# Patient Record
Sex: Male | Born: 1955
Health system: Southern US, Community
[De-identification: ages and names within clinical notes are randomized; demographics above are authoritative.]

## PROBLEM LIST (undated history)

## (undated) DIAGNOSIS — N400 Enlarged prostate without lower urinary tract symptoms: Secondary | ICD-10-CM

## (undated) DIAGNOSIS — G4700 Insomnia, unspecified: Secondary | ICD-10-CM

## (undated) DIAGNOSIS — E785 Hyperlipidemia, unspecified: Secondary | ICD-10-CM

## (undated) DIAGNOSIS — I10 Essential (primary) hypertension: Secondary | ICD-10-CM

## (undated) DIAGNOSIS — F419 Anxiety disorder, unspecified: Secondary | ICD-10-CM

## (undated) DIAGNOSIS — K219 Gastro-esophageal reflux disease without esophagitis: Secondary | ICD-10-CM

## (undated) HISTORY — DX: Insomnia, unspecified: G47.00

## (undated) HISTORY — DX: Anxiety disorder, unspecified: F41.9

## (undated) HISTORY — DX: Hyperlipidemia, unspecified: E78.5

## (undated) HISTORY — DX: Benign prostatic hyperplasia without lower urinary tract symptoms: N40.0

## (undated) HISTORY — DX: Gastro-esophageal reflux disease without esophagitis: K21.9

## (undated) HISTORY — PX: HERNIA REPAIR: SHX51

---

## 2000-10-23 ENCOUNTER — Emergency Department (HOSPITAL_COMMUNITY): Admission: EM | Admit: 2000-10-23 | Discharge: 2000-10-23 | Payer: Self-pay

## 2008-01-12 ENCOUNTER — Emergency Department (HOSPITAL_COMMUNITY): Admission: EM | Admit: 2008-01-12 | Discharge: 2008-01-12 | Payer: Self-pay | Admitting: Emergency Medicine

## 2010-10-13 ENCOUNTER — Other Ambulatory Visit: Payer: Self-pay | Admitting: Family Medicine

## 2010-10-17 LAB — POCT I-STAT, CHEM 8
BUN: 19 mg/dL (ref 6–23)
Calcium, Ion: 1.12 mmol/L (ref 1.12–1.32)
Chloride: 108 mEq/L (ref 96–112)
Creatinine, Ser: 1.2 mg/dL (ref 0.4–1.5)
Glucose, Bld: 99 mg/dL (ref 70–99)
HCT: 47 % (ref 39.0–52.0)
Hemoglobin: 16 g/dL (ref 13.0–17.0)
Potassium: 3.8 mEq/L (ref 3.5–5.1)
Sodium: 141 mEq/L (ref 135–145)
TCO2: 26 mmol/L (ref 0–100)

## 2010-10-17 LAB — URINALYSIS, ROUTINE W REFLEX MICROSCOPIC
Glucose, UA: NEGATIVE mg/dL
Nitrite: NEGATIVE
Protein, ur: 100 mg/dL — AB
Specific Gravity, Urine: 1.028 (ref 1.005–1.030)
Urobilinogen, UA: 0.2 mg/dL (ref 0.0–1.0)
pH: 5.5 (ref 5.0–8.0)

## 2010-10-17 LAB — URINE CULTURE
Colony Count: NO GROWTH
Culture: NO GROWTH

## 2010-10-17 LAB — URINE MICROSCOPIC-ADD ON

## 2010-10-21 ENCOUNTER — Ambulatory Visit
Admission: RE | Admit: 2010-10-21 | Discharge: 2010-10-21 | Disposition: A | Discharge status: Home / Self Care | Payer: BC Managed Care – PPO | Source: Ambulatory Visit | Attending: Family Medicine | Admitting: Family Medicine

## 2011-08-27 ENCOUNTER — Encounter: Payer: Self-pay | Admitting: *Deleted

## 2011-09-09 ENCOUNTER — Ambulatory Visit: Payer: BC Managed Care – PPO | Admitting: Cardiology

## 2011-10-16 ENCOUNTER — Ambulatory Visit (INDEPENDENT_AMBULATORY_CARE_PROVIDER_SITE_OTHER): Payer: BC Managed Care – PPO | Admitting: Cardiology

## 2011-10-16 ENCOUNTER — Encounter: Payer: Self-pay | Admitting: Cardiology

## 2011-10-16 VITALS — BP 163/111 | HR 87 | Ht 69.0 in | Wt 197.1 lb

## 2011-10-16 DIAGNOSIS — Z9189 Other specified personal risk factors, not elsewhere classified: Secondary | ICD-10-CM

## 2011-10-16 DIAGNOSIS — I10 Essential (primary) hypertension: Secondary | ICD-10-CM

## 2011-10-16 MED ORDER — CHLORTHALIDONE 25 MG PO TABS: 25.0000 mg | ORAL_TABLET | Freq: Every day | ORAL | Status: DC

## 2011-10-16 MED ORDER — POTASSIUM CHLORIDE ER 10 MEQ PO TBCR: 10.0000 meq | EXTENDED_RELEASE_TABLET | Freq: Every day | ORAL | Status: DC

## 2011-10-16 MED ORDER — AMLODIPINE BESYLATE 10 MG PO TABS: 10.0000 mg | ORAL_TABLET | Freq: Every day | ORAL | Status: DC

## 2011-10-16 MED ORDER — ASPIRIN EC 81 MG PO TBEC: 81.0000 mg | DELAYED_RELEASE_TABLET | Freq: Every day | ORAL | Status: DC

## 2011-10-16 MED ORDER — AMLODIPINE BESYLATE 10 MG PO TABS: 10.0000 mg | ORAL_TABLET | Freq: Every day | ORAL | Status: AC

## 2011-10-16 NOTE — Patient Instructions (Addendum)
Your physician recommends that you schedule a follow-up appointment in:  1 month  Your physician has requested that you have an echocardiogram. Echocardiography is a painless test that uses sound waves to create images of your heart. It provides your doctor with information about the size and shape of your heart and how well your heart's chambers and valves are working. This procedure takes approximately one hour. There are no restrictions for this procedure.   Your physician has requested that you have an exercise tolerance test. Can be done with NP or PA.  For further information please visit [email or web address] Please also follow instruction sheet, as given.  Your physician recommends that you return for fasting lab work in:  2 weeks--Lipid and liver profiles and BMP   Your physician has recommended you make the following change in your medication: Stop hydrochlorothiazide. Increase amlodipine to 10 mg by mouth daily. Start chlorthalidone 25 mg by mouth daily. Start potassium 10 meq by mouth daily.  Start aspirin 81 mg by mouth daily.    Check blood pressure daily and keep record.  Katina Dung (Dr. Alford Highland nurse) will call you in 2 weeks to get these readings.

## 2011-10-18 DIAGNOSIS — Z9189 Other specified personal risk factors, not elsewhere classified: Secondary | ICD-10-CM | POA: Insufficient documentation

## 2011-10-18 DIAGNOSIS — I10 Essential (primary) hypertension: Secondary | ICD-10-CM | POA: Insufficient documentation

## 2011-10-18 NOTE — Progress Notes (Signed)
Patient ID: Robert Neal, male   DOB: [birth year], 56 y.o.   MRN: [id number] PCP: Dr. Egbert Garibaldi  56 yo with history of resistant HTN presents for cardiology evaluation.  BP today is 163/111; he is often in this range when he checks at home.  He has had diagnosed HTN for about 5 years.  Lisinopril worked well to control his BP, but he felt "foggy" while taking it.  Symptomatically, he is doing well.  No chest pain or tightness.  No exertional dyspnea.  He is active at work.   ECG: NSR, normal except likely arm lead reversal  PMH: 1. Anxiety 2. GERD 3. Inguinal hernia repair 4. HTN 5. Plantar fasciitis  SH: Married, Music therapist.  Lives in [place].  Prior smoker but has quit.  FH: Mother lived to 41.  Father with lung cancer.   ROS: All systems reviewed and negative except as noted in HPI  Current Outpatient Prescriptions  Medication Sig Dispense Refill  . amLODipine (NORVASC) 10 MG tablet Take 1 tablet (10 mg total) by mouth daily.  30 tablet  6  . omeprazole (PRILOSEC) 20 MG capsule Take 20 mg by mouth daily.      Marland Kitchen aspirin EC 81 MG tablet Take 1 tablet (81 mg total) by mouth daily.  30 tablet  3  . chlorthalidone (HYGROTON) 25 MG tablet Take 1 tablet (25 mg total) by mouth daily.  30 tablet  6  . potassium chloride (K-DUR) 10 MEQ tablet Take 1 tablet (10 mEq total) by mouth daily.  30 tablet  6    BP 163/111  Pulse 87  Ht 5\' 9"  (1.753 m)  Wt 197 lb 2 oz (89.415 kg)  BMI 29.11 kg/m2 General: NAD Neck: No JVD, no thyromegaly or thyroid nodule.  Lungs: Clear to auscultation bilaterally with normal respiratory effort. CV: Nondisplaced PMI.  Heart regular S1/S2, soft S4, no murmur.  No peripheral edema.  No carotid bruit.  Normal pedal pulses.  Abdomen: Soft, nontender, no hepatosplenomegaly, no distention.  Skin: Intact without lesions or rashes.  Neurologic: Alert and oriented x 3.  Psych: Normal affect. Extremities: No clubbing or cyanosis.  HEENT: Normal.    Assessment/Plan: 1. Resistant HTN:  He is unable to tolerate lisinopril.   - Increase amlodipine to 10 mg daily.  - Stop HCTZ and use chlorthalidone 25 mg daily instead.   I will also start KCL 10 mEq daily.   - BP check daily (patient has home cuff).  We will call in 2 wks for readings.   - BMET in 2 wks.  - Echocardiogram - Followup in 1 month. 2. CAD risk: I will check his lipids.  I am going to have him do an ETT.  He should take ASA 81 mg daily.   Christan Ciccarelli [employer]

## 2011-10-30 ENCOUNTER — Telehealth: Payer: Self-pay | Admitting: *Deleted

## 2011-10-30 ENCOUNTER — Other Ambulatory Visit (INDEPENDENT_AMBULATORY_CARE_PROVIDER_SITE_OTHER): Payer: BC Managed Care – PPO

## 2011-10-30 ENCOUNTER — Other Ambulatory Visit: Payer: Self-pay | Admitting: *Deleted

## 2011-10-30 ENCOUNTER — Ambulatory Visit (HOSPITAL_COMMUNITY): Payer: BC Managed Care – PPO | Attending: Cardiology | Admitting: Radiology

## 2011-10-30 DIAGNOSIS — I10 Essential (primary) hypertension: Secondary | ICD-10-CM

## 2011-10-30 DIAGNOSIS — E876 Hypokalemia: Secondary | ICD-10-CM

## 2011-10-30 LAB — LIPID PANEL
Cholesterol: 227 mg/dL — ABNORMAL HIGH (ref 0–200)
HDL: 72.5 mg/dL (ref >39.00)
Total CHOL/HDL Ratio: 3
Triglycerides: 88 mg/dL (ref 0.0–149.0)
VLDL: 17.6 mg/dL (ref 0.0–40.0)

## 2011-10-30 LAB — HEPATIC FUNCTION PANEL
ALT: 34 U/L (ref 0–53)
AST: 26 U/L (ref 0–37)
Albumin: 4.1 g/dL (ref 3.5–5.2)
Alkaline Phosphatase: 54 U/L (ref 39–117)
Bilirubin, Direct: 0.1 mg/dL (ref 0.0–0.3)
Total Bilirubin: 0.7 mg/dL (ref 0.3–1.2)
Total Protein: 7.9 g/dL (ref 6.0–8.3)

## 2011-10-30 LAB — BASIC METABOLIC PANEL
BUN: 11 mg/dL (ref 6–23)
CO2: 33 mEq/L — ABNORMAL HIGH (ref 19–32)
Calcium: 9.8 mg/dL (ref 8.4–10.5)
Chloride: 97 mEq/L (ref 96–112)
Creatinine, Ser: 0.9 mg/dL (ref 0.4–1.5)
GFR: 90.86 mL/min (ref >60.00)
Glucose, Bld: 84 mg/dL (ref 70–99)
Potassium: 3.3 mEq/L — ABNORMAL LOW (ref 3.5–5.1)
Sodium: 138 mEq/L (ref 135–145)

## 2011-10-30 LAB — LDL CHOLESTEROL, DIRECT: Direct LDL: 122.8 mg/dL

## 2011-10-30 MED ORDER — POTASSIUM CHLORIDE ER 10 MEQ PO TBCR: EXTENDED_RELEASE_TABLET | ORAL | Status: DC

## 2011-10-30 NOTE — Telephone Encounter (Signed)
Resistant HTN: He is unable to tolerate lisinopril.  - Increase amlodipine to 10 mg daily.  - Stop HCTZ and use chlorthalidone 25 mg daily instead. I will also start KCL 10 mEq daily.

## 2011-10-30 NOTE — Telephone Encounter (Signed)
10/30/11 recent BP readings: 140/ 98.

## 2011-10-30 NOTE — Telephone Encounter (Signed)
Pt states BP is improving and he will continue to check it . I will forward to Dr Shirlee Latch for review.

## 2011-10-30 NOTE — Progress Notes (Signed)
Echocardiogram performed.  

## 2011-11-02 ENCOUNTER — Ambulatory Visit (INDEPENDENT_AMBULATORY_CARE_PROVIDER_SITE_OTHER): Payer: BC Managed Care – PPO | Admitting: Physician Assistant

## 2011-11-02 DIAGNOSIS — I10 Essential (primary) hypertension: Secondary | ICD-10-CM

## 2011-11-02 NOTE — Procedures (Signed)
Exercise Treadmill Test  Pre-Exercise Testing Evaluation Rhythm: normal sinus  Rate: 80   PR:  .15 QRS:  .08  QT:  .40 QTc: .46     Test  Exercise Tolerance Test Ordering MD: Marca Ancona, MD  Interpreting MD: Tereso Newcomer , PA-C  Unique Test No: 1  Treadmill:  1  Indication for ETT: HTN  Contraindication to ETT: No   Stress Modality: exercise - treadmill  Cardiac Imaging Performed: non   Protocol: standard Bruce - maximal  Max BP:  196/86  Max MPHR (bpm):  166 85% MPR (bpm):  141  MPHR obtained (bpm):  151 % MPHR obtained:  90%  Reached 85% MPHR (min:sec):  8:30 Total Exercise Time (min-sec):  10:00  Workload in METS:  11.7 Borg Scale: 17  Reason ETT Terminated:  desired heart rate attained    ST Segment Analysis At Rest: normal ST segments - no evidence of significant ST depression With Exercise: no evidence of significant ST depression  Other Information Arrhythmia:  One Ventricular Couplet during exercise Angina during ETT:  absent (0) Quality of ETT:  diagnostic  ETT Interpretation:  normal - no evidence of ischemia by ST analysis  Comments: Good exercise tolerance. No chest pain. Normal BP response to exercise. No ST-T changes to suggest ischemia.   Recommendations: Results d/w Rolland Porter. Follow up with Dr. Marca Ancona as directed. Luna Glasgow, PA-C  11:24 AM 11/02/2011

## 2011-11-09 ENCOUNTER — Other Ambulatory Visit: Payer: BC Managed Care – PPO

## 2012-04-24 ENCOUNTER — Encounter (HOSPITAL_COMMUNITY): Payer: Self-pay | Admitting: Physical Medicine and Rehabilitation

## 2012-04-24 ENCOUNTER — Emergency Department (HOSPITAL_COMMUNITY): Payer: BC Managed Care – PPO

## 2012-04-24 ENCOUNTER — Emergency Department (HOSPITAL_COMMUNITY)
Admission: EM | Admit: 2012-04-24 | Discharge: 2012-04-24 | Disposition: A | Discharge status: Home / Self Care | Payer: BC Managed Care – PPO | Attending: Emergency Medicine | Admitting: Emergency Medicine

## 2012-04-24 DIAGNOSIS — H538 Other visual disturbances: Secondary | ICD-10-CM | POA: Insufficient documentation

## 2012-04-24 DIAGNOSIS — E876 Hypokalemia: Secondary | ICD-10-CM | POA: Insufficient documentation

## 2012-04-24 DIAGNOSIS — K219 Gastro-esophageal reflux disease without esophagitis: Secondary | ICD-10-CM | POA: Insufficient documentation

## 2012-04-24 DIAGNOSIS — Z7982 Long term (current) use of aspirin: Secondary | ICD-10-CM | POA: Insufficient documentation

## 2012-04-24 DIAGNOSIS — I10 Essential (primary) hypertension: Secondary | ICD-10-CM | POA: Insufficient documentation

## 2012-04-24 DIAGNOSIS — Z79899 Other long term (current) drug therapy: Secondary | ICD-10-CM | POA: Insufficient documentation

## 2012-04-24 DIAGNOSIS — R7309 Other abnormal glucose: Secondary | ICD-10-CM | POA: Insufficient documentation

## 2012-04-24 DIAGNOSIS — Z87891 Personal history of nicotine dependence: Secondary | ICD-10-CM | POA: Insufficient documentation

## 2012-04-24 DIAGNOSIS — R739 Hyperglycemia, unspecified: Secondary | ICD-10-CM

## 2012-04-24 DIAGNOSIS — R42 Dizziness and giddiness: Secondary | ICD-10-CM | POA: Insufficient documentation

## 2012-04-24 HISTORY — DX: Essential (primary) hypertension: I10

## 2012-04-24 LAB — CBC WITH DIFFERENTIAL/PLATELET
Basophils Absolute: 0 10*3/uL (ref 0.0–0.1)
Basophils Relative: 0 % (ref 0–1)
Eosinophils Absolute: 0.1 10*3/uL (ref 0.0–0.7)
Eosinophils Relative: 1 % (ref 0–5)
HCT: 46.7 % (ref 39.0–52.0)
Hemoglobin: 17.6 g/dL — ABNORMAL HIGH (ref 13.0–17.0)
Lymphocytes Relative: 17 % (ref 12–46)
Lymphs Abs: 1.3 10*3/uL (ref 0.7–4.0)
MCH: 34 pg (ref 26.0–34.0)
MCHC: 37.7 g/dL — ABNORMAL HIGH (ref 30.0–36.0)
MCV: 90.2 fL (ref 78.0–100.0)
Monocytes Absolute: 0.7 10*3/uL (ref 0.1–1.0)
Monocytes Relative: 10 % (ref 3–12)
Neutro Abs: 5.4 10*3/uL (ref 1.7–7.7)
Neutrophils Relative %: 72 % (ref 43–77)
Platelets: 270 10*3/uL (ref 150–400)
RBC: 5.18 MIL/uL (ref 4.22–5.81)
RDW: 12.3 % (ref 11.5–15.5)
WBC: 7.2 10*3/uL (ref 4.0–10.5)

## 2012-04-24 LAB — COMPREHENSIVE METABOLIC PANEL
ALT: 42 U/L (ref 0–53)
AST: 34 U/L (ref 0–37)
Albumin: 4.7 g/dL (ref 3.5–5.2)
Alkaline Phosphatase: 76 U/L (ref 39–117)
BUN: 24 mg/dL — ABNORMAL HIGH (ref 6–23)
CO2: 27 mEq/L (ref 19–32)
Calcium: 10.4 mg/dL (ref 8.4–10.5)
Chloride: 99 mEq/L (ref 96–112)
Creatinine, Ser: 1.05 mg/dL (ref 0.50–1.35)
GFR calc Af Amer: >90 mL/min (ref >90)
GFR calc non Af Amer: 78 mL/min — ABNORMAL LOW (ref >90)
Glucose, Bld: 169 mg/dL — ABNORMAL HIGH (ref 70–99)
Potassium: 2.8 mEq/L — ABNORMAL LOW (ref 3.5–5.1)
Sodium: 138 mEq/L (ref 135–145)
Total Bilirubin: 0.8 mg/dL (ref 0.3–1.2)
Total Protein: 8.5 g/dL — ABNORMAL HIGH (ref 6.0–8.3)

## 2012-04-24 MED ORDER — POTASSIUM CHLORIDE CRYS ER 20 MEQ PO TBCR
40.0000 meq | EXTENDED_RELEASE_TABLET | Freq: Once | ORAL | Status: AC
  Administered 2012-04-24: 40 meq via ORAL
  Filled 2012-04-24: qty 2

## 2012-04-24 NOTE — ED Notes (Signed)
Pt presents to department for evaluation of headache, blurred vision, and dizziness. Ongoing for several days. Also states hypertension. Pt is conscious alert and oriented x4. Pt states he is slurring speech and has been confused. Able to move all extremities. Speech clear. Onset of symptoms Wednesday morning.

## 2012-04-24 NOTE — ED Provider Notes (Signed)
History     CSN: [id number]  Arrival date & time 04/24/12  1416   First MD Initiated Contact with Patient 04/24/12 1612      Chief Complaint  Patient presents with  . Headache  . Dizziness  . Blurred Vision    (Consider location/radiation/quality/duration/timing/severity/associated sxs/prior treatment) HPI.... headache, blurred vision, dizziness for 3 days.   Severity is mild to moderate. Nothing makes symptoms better or worse. Patient is ambulatory without ataxia. He has hypertension for which he takes medication. Also history of hypokalemia. Drinks 4-6 beers a day. No cigarettes or Street drugs. Works as a Music therapist.  No fever, chills, stiff neck, chest pain, dyspnea   Past Medical History  Diagnosis Date  . Esophageal reflux   . Hypertension     Past Surgical History  Procedure Laterality Date  . Hernia repair      History reviewed. No pertinent family history.  History  Substance Use Topics  . Smoking status: Former Games developer  . Smokeless tobacco: Never Used  . Alcohol Use: Yes     Comment: occasional      Review of Systems  All other systems reviewed and are negative.    Allergies  Review of patient's allergies indicates no known allergies.  Home Medications   Current Outpatient Rx  Name  Route  Sig  Dispense  Refill  . amLODipine (NORVASC) 10 MG tablet   Oral   Take 1 tablet (10 mg total) by mouth daily.   30 tablet   6   . aspirin EC 81 MG tablet   Oral   Take 1 tablet (81 mg total) by mouth daily.   30 tablet   3   . chlorthalidone (HYGROTON) 25 MG tablet   Oral   Take 1 tablet (25 mg total) by mouth daily.   30 tablet   6   . omeprazole (PRILOSEC) 20 MG capsule   Oral   Take 20 mg by mouth daily.         . potassium chloride (K-DUR) 10 MEQ tablet      1 tablet three times a day   90 tablet   3     BP 136/94  Pulse 84  Temp(Src) 98.9 F (37.2 C) (Oral)  Resp 16  SpO2 99%  Physical Exam  Nursing note and vitals  reviewed. Constitutional: He is oriented to person, place, and time. He appears well-developed and well-nourished.  HENT:  Head: Normocephalic and atraumatic.  Eyes: Conjunctivae and EOM are normal. Pupils are equal, round, and reactive to light.  Neck: Normal range of motion. Neck supple.  Cardiovascular: Normal rate, regular rhythm and normal heart sounds.   Pulmonary/Chest: Effort normal and breath sounds normal.  Abdominal: Soft. Bowel sounds are normal.  Musculoskeletal: Normal range of motion.  Neurological: He is alert and oriented to person, place, and time.  Skin: Skin is warm and dry.  Psychiatric: He has a normal mood and affect.    ED Course  Procedures (including critical care time)  Labs Reviewed  CBC WITH DIFFERENTIAL - Abnormal; Notable for the following:    Hemoglobin 17.6 (*)    MCHC 37.7 (*)    All other components within normal limits  COMPREHENSIVE METABOLIC PANEL - Abnormal; Notable for the following:    Potassium 2.8 (*)    Glucose, Bld 169 (*)    BUN 24 (*)    Total Protein 8.5 (*)    GFR calc non Af Amer 78 (*)  All other components within normal limits   Ct Head Wo Contrast  04/24/2012  *RADIOLOGY REPORT*  Clinical Data: Headache and blurry vision.  CT HEAD WITHOUT CONTRAST  Technique:  Contiguous axial images were obtained from the base of the skull through the vertex without contrast.  Comparison: None.  Findings: The brain appears normal without infarct, hemorrhage, mass lesion, mass effect, midline shift or abnormal extra-axial fluid collection.  No hydrocephalus or pneumocephalus.  Calvarium intact.  IMPRESSION: Negative head CT.   Original Report Authenticated By: Holley Dexter, M.D.      1. Hyperglycemia   2. Hypokalemia       MDM  Normal physical exam. Discussed hyperglycemia and hypokalemia. CT scan of head negative. Patient has primary care followup.        Donnetta Hutching, MD 04/24/12 [phone]

## 2012-11-17 ENCOUNTER — Other Ambulatory Visit: Payer: Self-pay

## 2013-10-27 ENCOUNTER — Other Ambulatory Visit: Payer: Self-pay

## 2015-04-17 ENCOUNTER — Ambulatory Visit (INDEPENDENT_AMBULATORY_CARE_PROVIDER_SITE_OTHER): Payer: BLUE CROSS/BLUE SHIELD | Admitting: Pulmonary Disease

## 2015-04-17 ENCOUNTER — Encounter: Payer: Self-pay | Admitting: Pulmonary Disease

## 2015-04-17 ENCOUNTER — Ambulatory Visit (INDEPENDENT_AMBULATORY_CARE_PROVIDER_SITE_OTHER)
Admission: RE | Admit: 2015-04-17 | Discharge: 2015-04-17 | Disposition: A | Discharge status: Home / Self Care | Payer: BLUE CROSS/BLUE SHIELD | Source: Ambulatory Visit | Attending: Pulmonary Disease | Admitting: Pulmonary Disease

## 2015-04-17 VITALS — BP 114/66 | HR 72 | Ht 69.0 in | Wt 202.9 lb

## 2015-04-17 DIAGNOSIS — R0600 Dyspnea, unspecified: Secondary | ICD-10-CM

## 2015-04-17 DIAGNOSIS — R0689 Other abnormalities of breathing: Secondary | ICD-10-CM

## 2015-04-17 MED ORDER — ALBUTEROL SULFATE HFA 108 (90 BASE) MCG/ACT IN AERS: 2.0000 | INHALATION_SPRAY | Freq: Four times a day (QID) | RESPIRATORY_TRACT | Status: DC | PRN

## 2015-04-17 NOTE — Patient Instructions (Signed)
Reschedule you for chest x-ray pulmonary function tests and a home sleep study. He'll be started on albuterol inhaler to be used as needed up to 4 times a day  Return to clinic after the sleep study

## 2015-04-17 NOTE — Progress Notes (Signed)
Subjective:    Patient ID: Robert Neal, male    DOB: October 21, 1955, 60 y.o.   MRN: [id number]  HPI Consult for evaluation of dyspnea.  Robert Neal is a 60 year old with possible history of hypertension, GERD. He has chief complaints of dyspnea on exertion, chest tightness, wheezing. He has daily symptoms of cough with white mucus production. He has history of GERD and seems to be adequately controlled on 20 mg of Prilosec daily. He was evaluated by ENT several times including a scope. No abnormalities identified. He was started on Flonase and a decongestant but this has not helped with her symptoms. He has no history of allergies, nasal discharge, postnasal drip. He has never seen a pulmonologist or been tried on any inhalers. He has history of snoring at nighttime however he denies any witnessed apneas, daytime somnolence.   Social History: 8 pack year smoking history. He quit in 2015. He drinks up to 10 beers/day. No illegal drug use. He works as a Hospital doctor for [employer] History: Lung cancer- father Jeannetta Ellis smoker]  Past Medical History  Diagnosis Date  . Esophageal reflux   . Hypertension   . Anxiety   . Hyperlipidemia   . Insomnia   . BPH (benign prostatic hypertrophy)     Current outpatient prescriptions:  .  ALPRAZolam (XANAX) 0.5 MG tablet, Take 0.5 mg by mouth 3 (three) times daily as needed., Disp: , Rfl: 1 .  amLODipine (NORVASC) 10 MG tablet, Take 1 tablet (10 mg total) by mouth daily., Disp: 30 tablet, Rfl: 6 .  aspirin EC 81 MG tablet, Take 1 tablet (81 mg total) by mouth daily., Disp: 30 tablet, Rfl: 3 .  chlorthalidone (HYGROTON) 25 MG tablet, Take 1 tablet (25 mg total) by mouth daily., Disp: 30 tablet, Rfl: 6 .  cyanocobalamin (,VITAMIN B-12,) 1000 MCG/ML injection, 1 injection every month, Disp: , Rfl: 2 .  omeprazole (PRILOSEC) 20 MG capsule, Take 20 mg by mouth daily., Disp: , Rfl:  .  potassium chloride (K-DUR,KLOR-CON) 10 MEQ tablet, Take 10 mEq by mouth 2 (two)  times daily., Disp: , Rfl: 1 .  zolpidem (AMBIEN) 10 MG tablet, Take 10 mg by mouth at bedtime as needed for sleep., Disp: , Rfl:  .  albuterol (PROVENTIL HFA;VENTOLIN HFA) 108 (90 Base) MCG/ACT inhaler, Inhale 2 puffs into the lungs every 6 (six) hours as needed for wheezing or shortness of breath., Disp: 1 Inhaler, Rfl: 3  Review of Systems Dyspnea, wheezing, cough with white sputum. No fevers, chills, hemoptysis. No nausea, vomiting, diarrhea, constipation. No chest pain, palpitation. All other ROS are negative    Objective:   Physical Exam Blood pressure 114/66, pulse 72, height  (1.753 m), weight 202 lb 14.4 oz (92.035 kg), SpO2 98 %. Gen: No apparent distress Neuro: No gross focal deficits. HEENT: No JVD, lymphadenopathy, thyromegaly. RS: Clear, No wheeze or crackles CVS: S1-S2 heard, no murmurs rubs gallops. Abdomen: Soft, positive bowel sounds. Musculoskeletal: No edema.    Assessment & Plan:  Dyspnea associated with chest tightness, wheezing.  Symptoms suggestive of reactive airway disease/asthma. He may have COPD and does not have a heavy smoking history. His GERD may be contributing to his symptoms however he seems well controlled on the current dose of PPI. ENT evaluations in the past have not revealed LPR. I'll evaluate him by getting a chest x-ray, PFTs. He'll be started on an albuterol inhaler.  He has some symptoms of sleep apnea and will be scheduled for  home sleep study.  Plan: - CXR, PFTs - Albuterol inhaler - Home sleep study.  Chilton GreathousePraveen Chauntay Paszkiewicz MD [facility] Pager [phone] If no answer or after 3pm call: [phone] 04/17/2015, 9:44 AM

## 2015-04-18 ENCOUNTER — Ambulatory Visit (HOSPITAL_COMMUNITY)
Admission: RE | Admit: 2015-04-18 | Discharge: 2015-04-18 | Disposition: A | Discharge status: Home / Self Care | Payer: BLUE CROSS/BLUE SHIELD | Source: Ambulatory Visit | Attending: Pulmonary Disease | Admitting: Pulmonary Disease

## 2015-04-18 DIAGNOSIS — R0600 Dyspnea, unspecified: Secondary | ICD-10-CM

## 2015-04-18 DIAGNOSIS — R0689 Other abnormalities of breathing: Secondary | ICD-10-CM | POA: Diagnosis not present

## 2015-04-18 LAB — PULMONARY FUNCTION TEST
DL/VA % pred: 99 %
DL/VA: 4.53 ml/min/mmHg/L
DLCO unc % pred: 95 %
DLCO unc: 29.7 ml/min/mmHg
FEF 25-75 Post: 4.55 L/sec
FEF 25-75 Pre: 2.62 L/sec
FEF2575-%Change-Post: 73 %
FEF2575-%Pred-Post: 153 %
FEF2575-%Pred-Pre: 88 %
FEV1-%Change-Post: 14 %
FEV1-%Pred-Post: 115 %
FEV1-%Pred-Pre: 100 %
FEV1-Post: 4.1 L
FEV1-Pre: 3.57 L
FEV1FVC-%Change-Post: 5 %
FEV1FVC-%Pred-Pre: 101 %
FEV6-%Change-Post: 9 %
FEV6-%Pred-Post: 113 %
FEV6-%Pred-Pre: 103 %
FEV6-Post: 5.04 L
FEV6-Pre: 4.62 L
FEV6FVC-%Change-Post: 0 %
FEV6FVC-%Pred-Post: 104 %
FEV6FVC-%Pred-Pre: 104 %
FVC-%Change-Post: 9 %
FVC-%Pred-Post: 108 %
FVC-%Pred-Pre: 99 %
FVC-Post: 5.05 L
FVC-Pre: 4.63 L
Post FEV1/FVC ratio: 81 %
Post FEV6/FVC ratio: 100 %
Pre FEV1/FVC ratio: 77 %
Pre FEV6/FVC Ratio: 100 %
RV % pred: 172 %
RV: 3.71 L
TLC % pred: 125 %
TLC: 8.51 L

## 2015-04-18 MED ORDER — ALBUTEROL SULFATE (2.5 MG/3ML) 0.083% IN NEBU
2.5000 mg | INHALATION_SOLUTION | Freq: Once | RESPIRATORY_TRACT | Status: AC
  Administered 2015-04-18: 2.5 mg via RESPIRATORY_TRACT

## 2015-04-22 NOTE — Progress Notes (Signed)
Quick Note:  Called spoke with patient and informed him of the cxr results / recs as stated by PM. Pt voiced his understanding and denied any questions/concerns. Pt is wondering about his PFT results. Dr Isaiah SergeMannam please advise, thank you. ______

## 2015-04-30 NOTE — Progress Notes (Signed)
Quick Note:  LMOM TCB x1. ______ 

## 2015-05-01 ENCOUNTER — Telehealth: Payer: Self-pay | Admitting: Pulmonary Disease

## 2015-05-01 NOTE — Telephone Encounter (Signed)
Notes Recorded by Chilton GreathousePraveen Mannam, MD on 04/26/2015 at 6:07 AM Please let the patient know that the PFTs are consistent with asthma. Recommend to continue the albuterol inhaler as prescribed for now. If his symptoms worsen then we can consider adding other inhalers.  Spoke with pt and notified of results per Dr. Isaiah SergeMannam. Pt verbalized understanding and denied any questions.

## 2015-05-03 NOTE — Progress Notes (Signed)
Quick Note:  Called and spoke to pt. Informed him of the results and recs per PM. Pt verbalized understanding and denied any further questions or concerns at this time.   ______

## 2015-05-10 ENCOUNTER — Institutional Professional Consult (permissible substitution): Payer: Self-pay | Admitting: Pulmonary Disease

## 2015-05-10 DIAGNOSIS — G4733 Obstructive sleep apnea (adult) (pediatric): Secondary | ICD-10-CM | POA: Diagnosis not present

## 2015-05-13 ENCOUNTER — Telehealth: Payer: Self-pay | Admitting: Pulmonary Disease

## 2015-05-13 DIAGNOSIS — G4730 Sleep apnea, unspecified: Secondary | ICD-10-CM

## 2015-05-13 DIAGNOSIS — G4733 Obstructive sleep apnea (adult) (pediatric): Secondary | ICD-10-CM | POA: Diagnosis not present

## 2015-05-13 NOTE — Telephone Encounter (Signed)
HST results received and reviewed by PM: Suggest atarting on autoCPAP 5-15cm H2O with heated humidifier, with mask fitting session.  Download in 1 month and follow up in 4-6 weeks after beginning CPAP.  LMOM TCB x1 to discuss results w/ patient

## 2015-05-13 NOTE — Telephone Encounter (Signed)
Called spoke with patient and discussed HST results / recs.  Pt voiced his understanding and denied any questions/concerns at this time. Order placed for new CPAP start. Pt is aware to contact the office for visit 4-6wks after he begins CPAP. Nothing further needed; will sign off.

## 2015-05-13 NOTE — Telephone Encounter (Signed)
[phone], pt cb

## 2015-05-14 ENCOUNTER — Other Ambulatory Visit: Payer: Self-pay | Admitting: *Deleted

## 2015-05-14 DIAGNOSIS — R0689 Other abnormalities of breathing: Principal | ICD-10-CM

## 2015-05-14 DIAGNOSIS — R0600 Dyspnea, unspecified: Secondary | ICD-10-CM

## 2015-05-27 ENCOUNTER — Other Ambulatory Visit (HOSPITAL_BASED_OUTPATIENT_CLINIC_OR_DEPARTMENT_OTHER): Payer: BLUE CROSS/BLUE SHIELD

## 2015-05-27 ENCOUNTER — Encounter (HOSPITAL_BASED_OUTPATIENT_CLINIC_OR_DEPARTMENT_OTHER): Payer: BLUE CROSS/BLUE SHIELD

## 2015-05-28 ENCOUNTER — Ambulatory Visit (HOSPITAL_BASED_OUTPATIENT_CLINIC_OR_DEPARTMENT_OTHER): Payer: BLUE CROSS/BLUE SHIELD | Attending: Pulmonary Disease | Admitting: Internal Medicine

## 2015-05-28 DIAGNOSIS — G4730 Sleep apnea, unspecified: Secondary | ICD-10-CM

## 2015-05-28 DIAGNOSIS — G4733 Obstructive sleep apnea (adult) (pediatric): Secondary | ICD-10-CM

## 2016-07-18 ENCOUNTER — Emergency Department (HOSPITAL_BASED_OUTPATIENT_CLINIC_OR_DEPARTMENT_OTHER)
Admission: EM | Admit: 2016-07-18 | Discharge: 2016-07-18 | Disposition: A | Discharge status: Home / Self Care | Payer: BLUE CROSS/BLUE SHIELD | Attending: Emergency Medicine | Admitting: Emergency Medicine

## 2016-07-18 ENCOUNTER — Emergency Department (HOSPITAL_BASED_OUTPATIENT_CLINIC_OR_DEPARTMENT_OTHER): Payer: BLUE CROSS/BLUE SHIELD

## 2016-07-18 ENCOUNTER — Encounter (HOSPITAL_BASED_OUTPATIENT_CLINIC_OR_DEPARTMENT_OTHER): Payer: Self-pay | Admitting: Emergency Medicine

## 2016-07-18 DIAGNOSIS — B9789 Other viral agents as the cause of diseases classified elsewhere: Secondary | ICD-10-CM | POA: Insufficient documentation

## 2016-07-18 DIAGNOSIS — I10 Essential (primary) hypertension: Secondary | ICD-10-CM | POA: Insufficient documentation

## 2016-07-18 DIAGNOSIS — R05 Cough: Secondary | ICD-10-CM | POA: Diagnosis present

## 2016-07-18 DIAGNOSIS — Z7982 Long term (current) use of aspirin: Secondary | ICD-10-CM | POA: Insufficient documentation

## 2016-07-18 DIAGNOSIS — J069 Acute upper respiratory infection, unspecified: Secondary | ICD-10-CM | POA: Insufficient documentation

## 2016-07-18 DIAGNOSIS — Z87891 Personal history of nicotine dependence: Secondary | ICD-10-CM | POA: Diagnosis not present

## 2016-07-18 DIAGNOSIS — Z79899 Other long term (current) drug therapy: Secondary | ICD-10-CM | POA: Insufficient documentation

## 2016-07-18 MED ORDER — ONDANSETRON 4 MG PO TBDP: ORAL_TABLET | ORAL | 0 refills | Status: DC

## 2016-07-18 MED ORDER — BENZONATATE 100 MG PO CAPS: 100.0000 mg | ORAL_CAPSULE | Freq: Three times a day (TID) | ORAL | 0 refills | Status: DC

## 2016-07-18 NOTE — ED Triage Notes (Signed)
Productive cough with white sputum and SOB since last night.

## 2016-07-18 NOTE — ED Notes (Signed)
Delay in xray, EKG in process

## 2016-07-18 NOTE — ED Provider Notes (Signed)
[facility] Provider Note   CSN: [id number] Arrival date & time: 07/18/16  1033     History   Chief Complaint Chief Complaint  Patient presents with  . Cough    HPI Robert Neal is a 61 y.o. male.  61 yo M with a chief complaint of cough. Patient states that he was out drinking last night and then when he tried to go to sleep and had a persistent cough that kept him up all night. Had some episodes of nausea but denies vomiting. Was coughing up a whitish phlegm. This morning he feels tired as well as has some chest discomfort. He denies fevers or chills. Is having some mild shortness of breath and feels that he has trouble getting a full breath in. Had some continued nausea this morning but now feels good enough to eat something.   The history is provided by the patient.  Cough  This is a new problem. The current episode started yesterday. The problem occurs constantly. The problem has been resolved. The cough is productive of sputum. There has been no fever. Associated symptoms include chest pain. Pertinent negatives include no chills, no headaches, no myalgias and no shortness of breath. He has tried cough syrup for the symptoms. The treatment provided no relief.    Past Medical History:  Diagnosis Date  . Anxiety   . BPH (benign prostatic hypertrophy)   . Esophageal reflux   . Hyperlipidemia   . Hypertension   . Insomnia     Patient Active Problem List   Diagnosis Date Noted  . Hypertension 10/18/2011  . Cardiovascular risk factor 10/18/2011    Past Surgical History:  Procedure Laterality Date  . HERNIA REPAIR         Home Medications    Prior to Admission medications   Medication Sig Start Date End Date Taking? Authorizing Provider  albuterol (PROVENTIL HFA;VENTOLIN HFA) 108 (90 Base) MCG/ACT inhaler Inhale 2 puffs into the lungs every 6 (six) hours as needed for wheezing or shortness of breath. 04/17/15  Yes Mannam, Praveen, MD  amLODipine (NORVASC)  10 MG tablet Take 1 tablet (10 mg total) by mouth daily. 10/16/11  Yes Laurey Morale, MD  omeprazole (PRILOSEC) 20 MG capsule Take 20 mg by mouth daily.   Yes [provider]  ALPRAZolam Prudy Feeler) 0.5 MG tablet Take 0.5 mg by mouth 3 (three) times daily as needed. 02/06/15   [provider]  aspirin EC 81 MG tablet Take 1 tablet (81 mg total) by mouth daily. 10/16/11   Laurey Morale, MD  benzonatate (TESSALON) 100 MG capsule Take 1 capsule (100 mg total) by mouth every 8 (eight) hours. 07/18/16   Melene Plan, DO  chlorthalidone (HYGROTON) 25 MG tablet Take 1 tablet (25 mg total) by mouth daily. 10/16/11   Laurey Morale, MD  cyanocobalamin (,VITAMIN B-12,) 1000 MCG/ML injection 1 injection every month 01/08/15   [provider]  ondansetron (ZOFRAN ODT) 4 MG disintegrating tablet 4mg  ODT q4 hours prn nausea/vomit 07/18/16   Melene Plan, DO  potassium chloride (K-DUR,KLOR-CON) 10 MEQ tablet Take 10 mEq by mouth 2 (two) times daily. 04/03/15   [provider]  zolpidem (AMBIEN) 10 MG tablet Take 10 mg by mouth at bedtime as needed for sleep.    [provider]    Family History Family History  Problem Relation Age of Onset  . Lung cancer Father        worked with chemicals, chain smoker  Social History Social History  Substance Use Topics  . Smoking status: Former Smoker    Packs/day: 1.00    Years: 8.00    Types: Cigarettes    Quit date: 01/12/2013  . Smokeless tobacco: Never Used     Comment: socially smoked x5129yrs  . Alcohol use 0.0 oz/week     Comment: 10 beers daily     Allergies   Patient has no known allergies.   Review of Systems Review of Systems  Constitutional: Negative for chills and fever.  HENT: Positive for congestion. Negative for facial swelling.   Eyes: Negative for discharge and visual disturbance.  Respiratory: Positive for cough. Negative for shortness of breath.   Cardiovascular: Positive for chest pain. Negative  for palpitations.  Gastrointestinal: Negative for abdominal pain, diarrhea and vomiting.  Musculoskeletal: Negative for arthralgias and myalgias.  Skin: Negative for color change and rash.  Neurological: Negative for tremors, syncope and headaches.  Psychiatric/Behavioral: Negative for confusion and dysphoric mood.     Physical Exam Updated Vital Signs BP (!) 140/93 (BP Location: Right Arm)   Pulse 72   Temp 98.6 F (37 C) (Oral)   Resp 18   Ht 5\' 10"  (1.778 m)   Wt 90.7 kg (200 lb)   SpO2 99%   BMI 28.70 kg/m   Physical Exam  Constitutional: He is oriented to person, place, and time. He appears well-developed and well-nourished.  HENT:  Head: Normocephalic and atraumatic.  Swollen turbinates, posterior nasal drip, no noted sinus ttp, tm normal bilaterally.    Eyes: EOM are normal. Pupils are equal, round, and reactive to light.  Neck: Normal range of motion. Neck supple. No JVD present.  Cardiovascular: Normal rate and regular rhythm.  Exam reveals no gallop and no friction rub.   No murmur heard. Pulmonary/Chest: No respiratory distress. He has no wheezes. He has no rales.  Abdominal: He exhibits no distension and no mass. There is no tenderness. There is no rebound and no guarding.  Musculoskeletal: Normal range of motion.  Neurological: He is alert and oriented to person, place, and time.  Skin: No rash noted. No pallor.  Psychiatric: He has a normal mood and affect. His behavior is normal.  Nursing note and vitals reviewed.    ED Treatments / Results  Labs (all labs ordered are listed, but only abnormal results are displayed) Labs Reviewed - No data to display  EKG  EKG Interpretation  Date/Time:  Saturday July 18 2016 11:08:42 EDT Ventricular Rate:  63 PR Interval:    QRS Duration: 103 QT Interval:  438 QTC Calculation: 449 R Axis:   66 Text Interpretation:  Sinus rhythm No old tracing to compare Confirmed by Melene PlanFloyd, Layla Gramm [phone]([id number]) on 07/18/2016 11:13:01 AM  Also confirmed by Melene PlanFloyd, Jazmin Ley [phone]), editor Misty StanleyScales-Price, Shannon [phone])  on 07/18/2016 11:41:50 AM       Radiology Dg Chest 2 View  Result Date: 07/18/2016 CLINICAL DATA:  Productive cough.  Shortness of breath. EXAM: CHEST  2 VIEW COMPARISON:  April 17, 2015 FINDINGS: The heart size and mediastinal contours are within normal limits. Both lungs are clear. The visualized skeletal structures are unremarkable. IMPRESSION: No active cardiopulmonary disease. Electronically Signed   By: Gerome Samavid  Williams III M.D   On: 07/18/2016 11:29    Procedures Procedures (including critical care time)  Medications Ordered in ED Medications - No data to display   Initial Impression / Assessment and Plan / ED Course  I have reviewed the triage vital signs  and the nursing notes.  Pertinent labs & imaging results that were available during my care of the patient were reviewed by me and considered in my medical decision making (see chart for details).     62 yo M with likely a URI. Difficult to tell as the symptoms just started about 10 hours ago. Has rhinorrhea with swollen turbinates. Abdominal exam is benign. Will obtain a chest x-ray and EKG as he is complaining of some chest pain. Feel this completely atypical for ACS and no further cardiac workup is needed.  Workup unremarkable. Discharge home.  2:55 PM:  I have discussed the diagnosis/risks/treatment options with the patient and family and believe the pt to be eligible for discharge home to follow-up with PCP. We also discussed returning to the ED immediately if new or worsening sx occur. We discussed the sx which are most concerning (e.g., sudden worsening pain, fever, inability to tolerate by mouth) that necessitate immediate return. Medications administered to the patient during their visit and any new prescriptions provided to the patient are listed below.  Medications given during this visit Medications - No data to display   The patient appears  reasonably screen and/or stabilized for discharge and I doubt any other medical condition or other [facility] requiring further screening, evaluation, or treatment in the ED at this time prior to discharge.    Final Clinical Impressions(s) / ED Diagnoses   Final diagnoses:  Viral URI with cough    New Prescriptions Discharge Medication List as of 07/18/2016 11:40 AM    START taking these medications   Details  benzonatate (TESSALON) 100 MG capsule Take 1 capsule (100 mg total) by mouth every 8 (eight) hours., Starting Sat 07/18/2016, Print    ondansetron (ZOFRAN ODT) 4 MG disintegrating tablet 4mg  ODT q4 hours prn nausea/vomit, Print         Melene Plan, DO 07/18/16 1455

## 2016-07-18 NOTE — Discharge Instructions (Signed)
Take tylenol 2 pills 4 times a day and motrin 4 pills 3 times a day.  Drink plenty of fluids.  Return for worsening shortness of breath, headache, confusion. Follow up with your family doctor.   I have prescribed a medicine for cough and nausea. Understand that coughing is a reflex and that no medicine will stop it. It may make it a little better.

## 2017-04-27 ENCOUNTER — Ambulatory Visit (INDEPENDENT_AMBULATORY_CARE_PROVIDER_SITE_OTHER): Payer: Managed Care, Other (non HMO)

## 2017-04-27 ENCOUNTER — Ambulatory Visit (INDEPENDENT_AMBULATORY_CARE_PROVIDER_SITE_OTHER): Payer: Managed Care, Other (non HMO) | Admitting: Podiatry

## 2017-04-27 ENCOUNTER — Encounter: Payer: Self-pay | Admitting: Podiatry

## 2017-04-27 ENCOUNTER — Other Ambulatory Visit: Payer: Self-pay | Admitting: Podiatry

## 2017-04-27 VITALS — BP 139/86 | HR 75 | Resp 16

## 2017-04-27 DIAGNOSIS — M722 Plantar fascial fibromatosis: Secondary | ICD-10-CM

## 2017-04-27 DIAGNOSIS — M779 Enthesopathy, unspecified: Secondary | ICD-10-CM

## 2017-04-27 DIAGNOSIS — M205X9 Other deformities of toe(s) (acquired), unspecified foot: Secondary | ICD-10-CM

## 2017-04-27 MED ORDER — METHYLPREDNISOLONE 4 MG PO TBPK: ORAL_TABLET | ORAL | 0 refills | Status: DC

## 2017-04-27 MED ORDER — MELOXICAM 15 MG PO TABS: 15.0000 mg | ORAL_TABLET | Freq: Every day | ORAL | 3 refills | Status: DC

## 2017-04-27 NOTE — Progress Notes (Signed)
Subjective:  Patient ID: Robert Neal, male    DOB: June 26, 1955,  MRN: [id number] HPI Chief Complaint  Patient presents with  . Foot Pain    1st MPJ right - aching x years, limited ROM, injury initially  . Foot Pain    Plantar arch/heel bilateral (L>R) - aching x several months, worse after being on them 4hours+  . Foot Pain    Dorsal foot left - sharp, shooting pain at work, stepped wrong, only one episode of it, hasn't happened since  . New Patient (Initial Visit)    62 y.o. male presents with the above complaint.   ROS: Denies fever chills nausea vomiting muscle aches pains calf pain shortness of breath back pain chest pain and headache.  Past Medical History:  Diagnosis Date  . Anxiety   . BPH (benign prostatic hypertrophy)   . Esophageal reflux   . Hyperlipidemia   . Hypertension   . Insomnia    Past Surgical History:  Procedure Laterality Date  . HERNIA REPAIR      Current Outpatient Medications:  .  hydrochlorothiazide (MICROZIDE) 12.5 MG capsule, Take 12.5 mg by mouth daily., Disp: , Rfl:  .  albuterol (PROVENTIL HFA;VENTOLIN HFA) 108 (90 Base) MCG/ACT inhaler, Inhale 2 puffs into the lungs every 6 (six) hours as needed for wheezing or shortness of breath., Disp: 1 Inhaler, Rfl: 3 .  amLODipine (NORVASC) 10 MG tablet, Take 1 tablet (10 mg total) by mouth daily., Disp: 30 tablet, Rfl: 6 .  meloxicam (MOBIC) 15 MG tablet, Take 1 tablet (15 mg total) by mouth daily., Disp: 30 tablet, Rfl: 3 .  methylPREDNISolone (MEDROL DOSEPAK) 4 MG TBPK tablet, 6 day dose pack - take as directed, Disp: 21 tablet, Rfl: 0 .  omeprazole (PRILOSEC) 20 MG capsule, Take 20 mg by mouth daily., Disp: , Rfl:  .  zolpidem (AMBIEN) 10 MG tablet, Take 10 mg by mouth at bedtime as needed for sleep., Disp: , Rfl:   No Known Allergies Review of Systems Objective:   Vitals:   04/27/17 0927  BP: 139/86  Pulse: 75  Resp: 16    General: Well developed, nourished, in no acute distress, alert and  oriented x3   Dermatological: Skin is warm, dry and supple bilateral. Nails x 10 are well maintained; remaining integument appears unremarkable at this time. There are no open sores, no preulcerative lesions, no rash or signs of infection present.  Vascular: Dorsalis Pedis artery and Posterior Tibial artery pedal pulses are 2/4 bilateral with immedate capillary fill time. Pedal hair growth present. No varicosities and no lower extremity edema present bilateral.   Neruologic: Grossly intact via light touch bilateral. Vibratory intact via tuning fork bilateral. Protective threshold with Semmes Wienstein monofilament intact to all pedal sites bilateral. Patellar and Achilles deep tendon reflexes 2+ bilateral. No Babinski or clonus noted bilateral.   Musculoskeletal: No gross boney pedal deformities bilateral. No pain, crepitus, or limitation noted with foot and ankle range of motion bilateral. Muscular strength 5/5 in all groups tested bilateral.  Pain on palpation medial calcaneal tubercle bilaterally.  No pain on medial and lateral compression of the calcaneus he has significant pain on attempted range of motion of the first metatarsophalangeal joint right foot but is so limited in motion it is nearly ankylosed.  Gait: Unassisted, Nonantalgic.    Radiographs:  Radiographs taken today demonstrate an osseously mature individual soft tissue increase in density plantar fascial calcaneal insertion sites bilateral right greater than left.  Also osteoarthritis  of the first metatarsal phalangeal joint right greater than left with joint space narrowing subchondral sclerosis and dorsal spurring.  Hallux limitus right.  Assessment & Plan:   Assessment: Plantar fasciitis bilateral.  Osteoarthritic changes first metatarsophalangeal joint bilateral right greater than left.  Plan: Discussed etiology pathology conservative versus surgical therapies.  At this point after sterile Betadine skin prep I injected 20  mg Kenalog 5 mg Marcaine to the point of maximal tenderness medial aspect of the bilateral heel at the plantar fascial calcaneal insertion site.  He tolerated procedure well without complications.  Also discussed surgical options for the first metatarsophalangeal joint which she would like to have this done at some point.  He states that he has plantar fascial braces and straps at home that he will use those.  Wrote him on a Medrol Dosepak to be followed by meloxicam.  Discussed appropriate shoe gear stretching exercises and ice therapy.     Max T. LangfordHyatt, North DakotaDPM

## 2018-09-07 IMAGING — CR DG CHEST 2V
2 series · 2 of 2 positions shown · non-contrast
Comparison: April 17, 2015

CLINICAL DATA: Productive cough.  Shortness of breath.

EXAM:
CHEST  2 VIEW

[w chest pa]
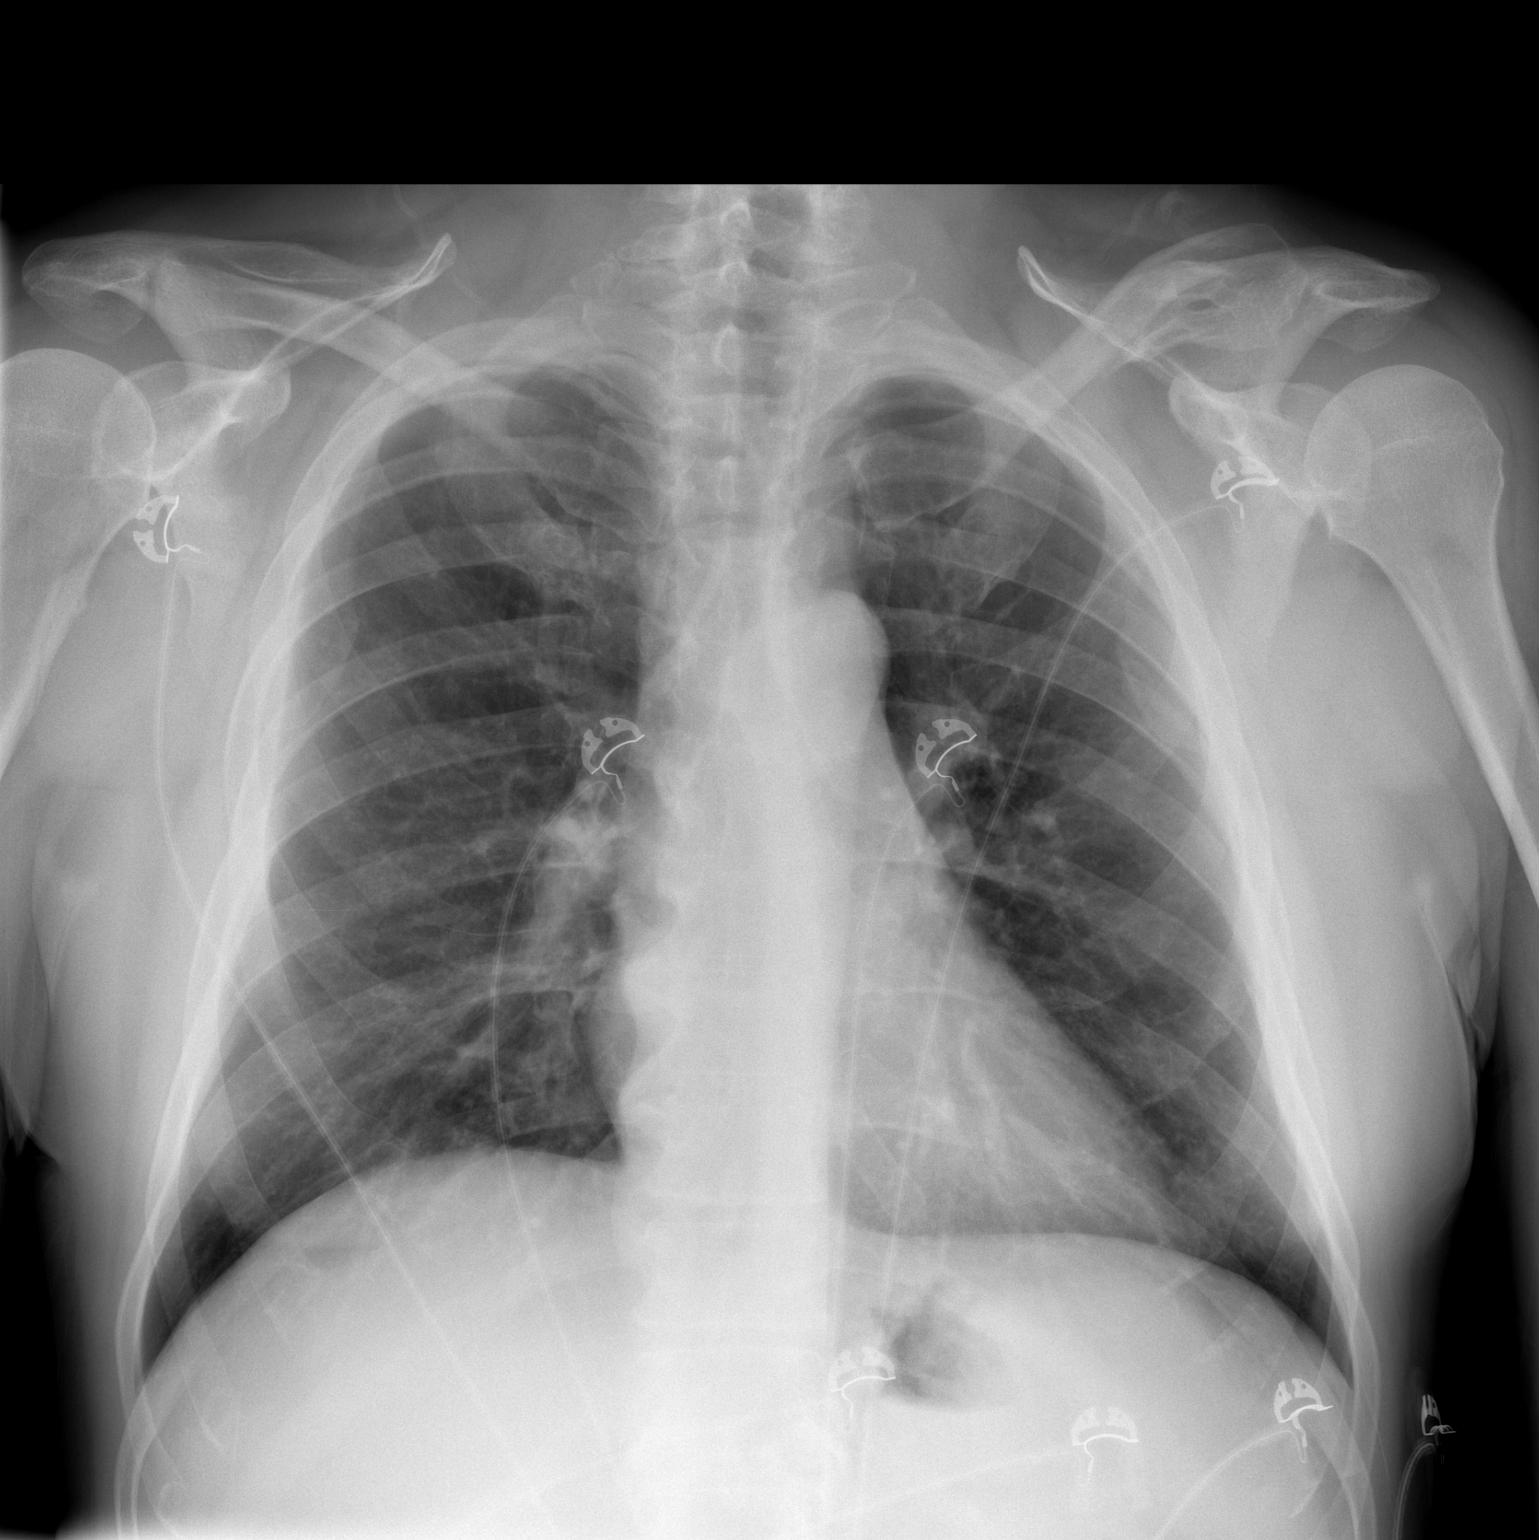

[w chest lat]
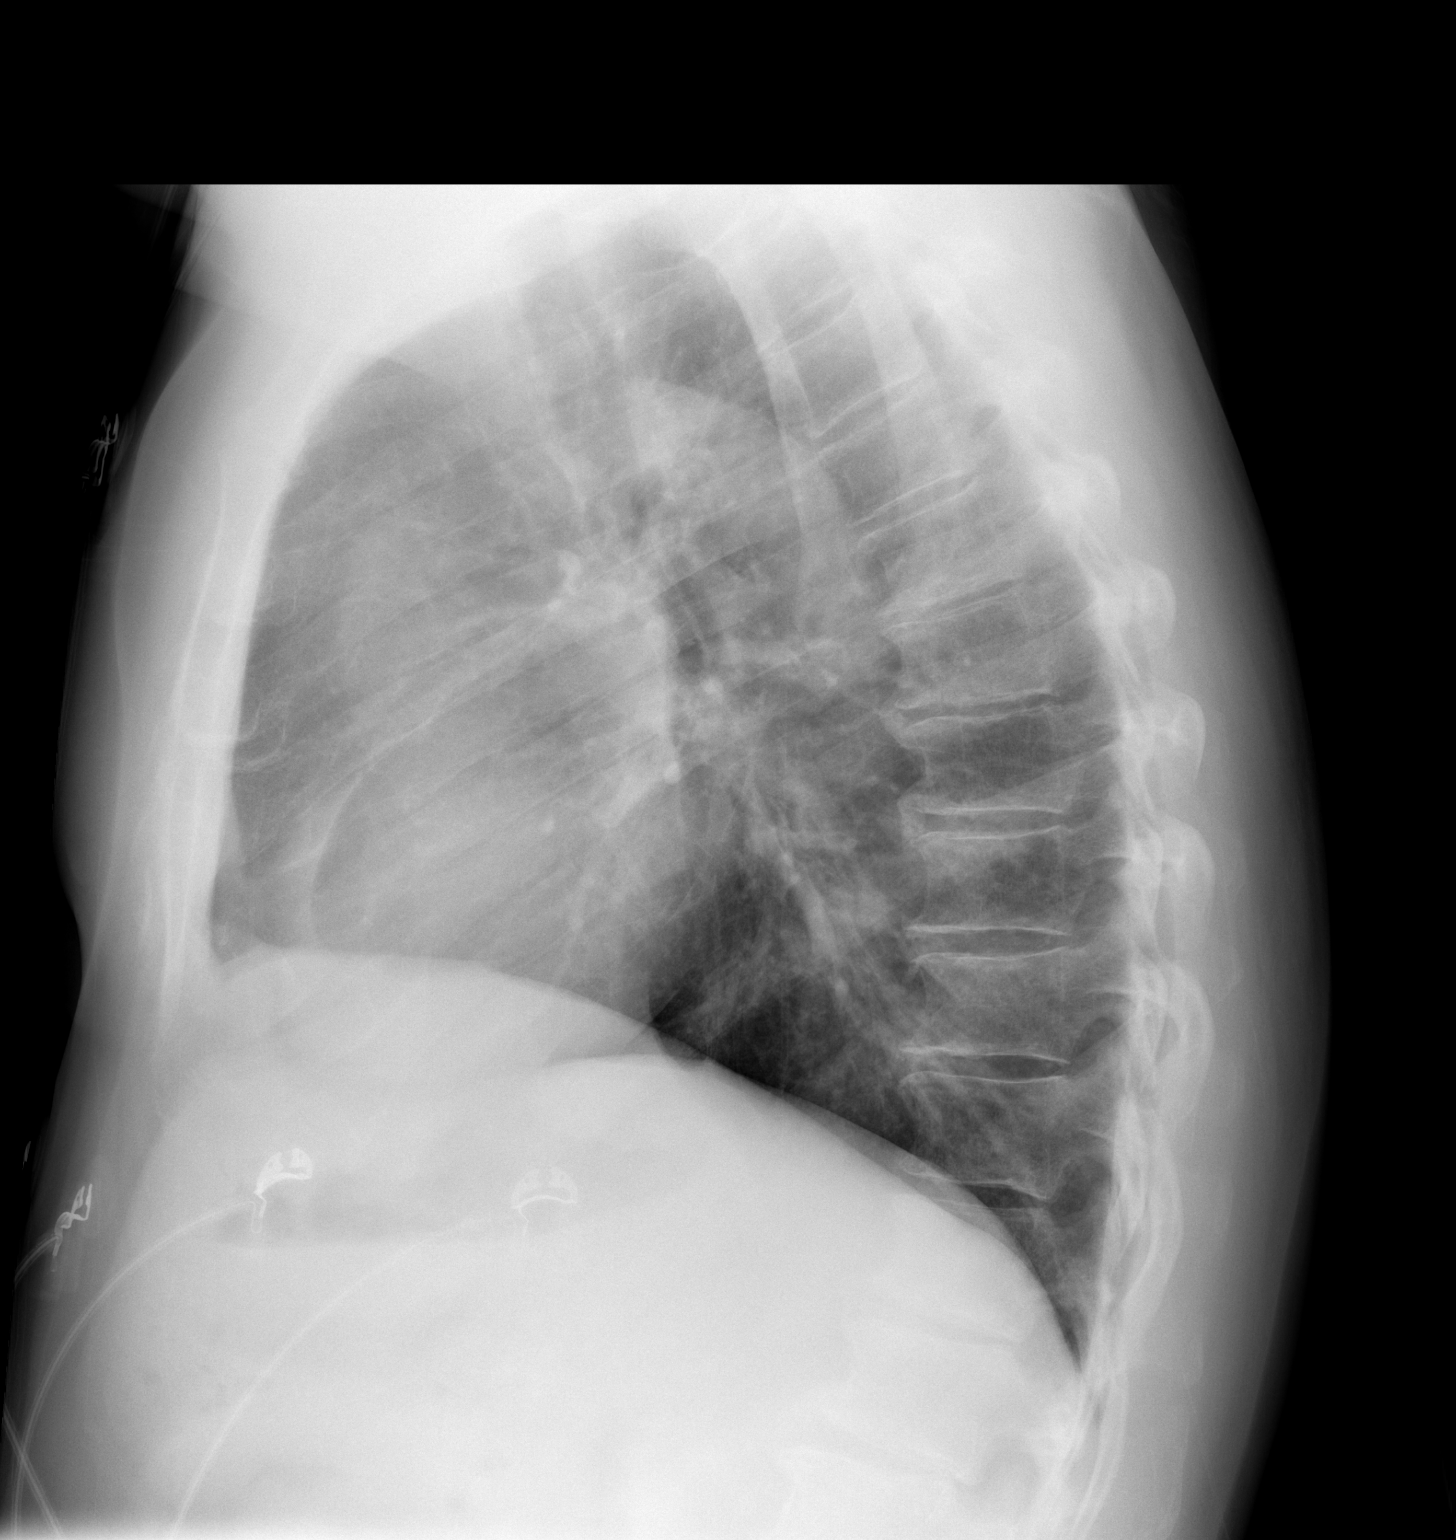

[2 of 2 positions shown; findings below may reference images not displayed]

FINDINGS: The heart size and mediastinal contours are within normal limits.
Both lungs are clear. The visualized skeletal structures are
unremarkable.
IMPRESSION: No active cardiopulmonary disease.

## 2019-04-08 ENCOUNTER — Ambulatory Visit: Payer: Self-pay | Attending: Internal Medicine

## 2019-04-08 DIAGNOSIS — Z23 Encounter for immunization: Secondary | ICD-10-CM

## 2019-04-08 NOTE — Progress Notes (Signed)
   Covid-19 Vaccination Clinic  Name:  Robert Neal    MRN: [id number] DOB: [birth year]  04/08/2019  [name] was observed post Covid-19 immunization for 15 minutes without incident. He was provided with Vaccine Information Sheet and instruction to access the V-Safe system.   [name] was instructed to call [phone] with any severe reactions post vaccine: Marland Kitchen Difficulty breathing  . Swelling of face and throat  . A fast heartbeat  . A bad rash all over body  . Dizziness and weakness   Immunizations Administered    Name Date Dose VIS Date Route   Pfizer COVID-19 Vaccine 04/08/2019  8:41 AM 0.3 mL 12/23/2018 Intramuscular   Manufacturer: [employer], [employer]   Lot: [id number]   NDC: [address]-9

## 2019-04-09 ENCOUNTER — Encounter (HOSPITAL_BASED_OUTPATIENT_CLINIC_OR_DEPARTMENT_OTHER): Payer: Self-pay | Admitting: Emergency Medicine

## 2019-04-09 ENCOUNTER — Emergency Department (HOSPITAL_BASED_OUTPATIENT_CLINIC_OR_DEPARTMENT_OTHER): Payer: Managed Care, Other (non HMO)

## 2019-04-09 ENCOUNTER — Emergency Department (HOSPITAL_BASED_OUTPATIENT_CLINIC_OR_DEPARTMENT_OTHER)
Admission: EM | Admit: 2019-04-09 | Discharge: 2019-04-09 | Disposition: A | Discharge status: Home / Self Care | Payer: Managed Care, Other (non HMO) | Attending: Emergency Medicine | Admitting: Emergency Medicine

## 2019-04-09 ENCOUNTER — Other Ambulatory Visit: Payer: Self-pay

## 2019-04-09 DIAGNOSIS — Z87891 Personal history of nicotine dependence: Secondary | ICD-10-CM | POA: Insufficient documentation

## 2019-04-09 DIAGNOSIS — Y939 Activity, unspecified: Secondary | ICD-10-CM | POA: Insufficient documentation

## 2019-04-09 DIAGNOSIS — M25512 Pain in left shoulder: Secondary | ICD-10-CM | POA: Insufficient documentation

## 2019-04-09 DIAGNOSIS — M79602 Pain in left arm: Secondary | ICD-10-CM

## 2019-04-09 DIAGNOSIS — Z79899 Other long term (current) drug therapy: Secondary | ICD-10-CM | POA: Diagnosis not present

## 2019-04-09 DIAGNOSIS — I10 Essential (primary) hypertension: Secondary | ICD-10-CM | POA: Diagnosis not present

## 2019-04-09 DIAGNOSIS — Y9259 Other trade areas as the place of occurrence of the external cause: Secondary | ICD-10-CM | POA: Insufficient documentation

## 2019-04-09 DIAGNOSIS — Y990 Civilian activity done for income or pay: Secondary | ICD-10-CM | POA: Diagnosis not present

## 2019-04-09 DIAGNOSIS — W19XXXA Unspecified fall, initial encounter: Secondary | ICD-10-CM

## 2019-04-09 DIAGNOSIS — W010XXA Fall on same level from slipping, tripping and stumbling without subsequent striking against object, initial encounter: Secondary | ICD-10-CM | POA: Diagnosis not present

## 2019-04-09 MED ORDER — NAPROXEN 500 MG PO TABS: 500.0000 mg | ORAL_TABLET | Freq: Two times a day (BID) | ORAL | 0 refills | Status: DC

## 2019-04-09 MED ORDER — CYCLOBENZAPRINE HCL 10 MG PO TABS: 10.0000 mg | ORAL_TABLET | Freq: Two times a day (BID) | ORAL | 0 refills | Status: AC | PRN

## 2019-04-09 MED ORDER — KETOROLAC TROMETHAMINE 60 MG/2ML IM SOLN
60.0000 mg | Freq: Once | INTRAMUSCULAR | Status: AC
  Administered 2019-04-09: 60 mg via INTRAMUSCULAR
  Filled 2019-04-09: qty 2

## 2019-04-09 NOTE — Discharge Instructions (Signed)
Thank you for allowing me to care for you today. Please return to the emergency department if you have new or worsening symptoms. Take your medications as instructed.  ° °

## 2019-04-09 NOTE — ED Triage Notes (Signed)
Tripped and fell in the dark garage last night. C/o pain to L shoulder and humerus. Also states he hit his head, denies LOC, no blood thinners.

## 2019-04-09 NOTE — ED Provider Notes (Signed)
[facility] EMERGENCY DEPARTMENT Provider Note   CSN: [id number] Arrival date & time: 04/09/19  1142     History Chief Complaint  Patient presents with  . Fall    Robert Neal is a 64 y.o. male.  Patient is a 64 year old gentleman with past medical history of hypertension presenting to the emergency department for injury to left arm.  Patient reports Robert Neal was at work yesterday and it was dark and Robert Neal had a mechanical fall where Robert Neal tripped over some objects and landed directly onto his left shoulder.  Reports significant swelling and pain to the left humerus since then.  Robert Neal did not pass out.  No head trauma.  No other injuries.  Reports pain is relieved at rest but when Robert Neal tries to extend or flex his forearm the pain returns.        Past Medical History:  Diagnosis Date  . Anxiety   . BPH (benign prostatic hypertrophy)   . Esophageal reflux   . Hyperlipidemia   . Hypertension   . Insomnia     Patient Active Problem List   Diagnosis Date Noted  . Hypertension 10/18/2011  . Cardiovascular risk factor 10/18/2011    Past Surgical History:  Procedure Laterality Date  . HERNIA REPAIR         Family History  Problem Relation Age of Onset  . Lung cancer Father        worked with chemicals, chain smoker    Social History   Tobacco Use  . Smoking status: Former Smoker    Packs/day: 1.00    Years: 8.00    Pack years: 8.00    Types: Cigarettes    Quit date: 01/12/2013    Years since quitting: 6.2  . Smokeless tobacco: Never Used  . Tobacco comment: socially smoked x53yrs  Substance Use Topics  . Alcohol use: Yes    Alcohol/week: 0.0 standard drinks    Comment: 10 beers daily  . Drug use: No    Home Medications Prior to Admission medications   Medication Sig Start Date End Date Taking? Authorizing Provider  albuterol (PROVENTIL HFA;VENTOLIN HFA) 108 (90 Base) MCG/ACT inhaler Inhale 2 puffs into the lungs every 6 (six) hours as needed for wheezing or  shortness of breath. 04/17/15   Mannam, Colbert Coyer, MD  amLODipine (NORVASC) 10 MG tablet Take 1 tablet (10 mg total) by mouth daily. 10/16/11   Laurey Morale, MD  cyclobenzaprine (FLEXERIL) 10 MG tablet Take 1 tablet (10 mg total) by mouth 2 (two) times daily as needed for up to 7 days for muscle spasms. 04/09/19 04/16/19  Arlyn Dunning, PA-C  hydrochlorothiazide (MICROZIDE) 12.5 MG capsule Take 12.5 mg by mouth daily.    [provider]  meloxicam (MOBIC) 15 MG tablet Take 1 tablet (15 mg total) by mouth daily. 04/27/17   Hyatt, Max T, DPM  methylPREDNISolone (MEDROL DOSEPAK) 4 MG TBPK tablet 6 day dose pack - take as directed 04/27/17   Hyatt, Max T, DPM  naproxen (NAPROSYN) 500 MG tablet Take 1 tablet (500 mg total) by mouth 2 (two) times daily. 04/09/19   Arlyn Dunning, PA-C  omeprazole (PRILOSEC) 20 MG capsule Take 20 mg by mouth daily.    [provider]  zolpidem (AMBIEN) 10 MG tablet Take 10 mg by mouth at bedtime as needed for sleep.    [provider]    Allergies    Acetaminophen  Review of Systems   Review of Systems  Constitutional: Negative for fever.  Musculoskeletal: Positive for joint swelling. Negative for neck pain.  Skin: Negative for rash and wound.  Neurological: Negative for syncope, light-headedness and headaches.    Physical Exam Updated Vital Signs BP (!) 151/92 (BP Location: Right Arm)   Pulse 87   Temp 98.5 F (36.9 C) (Oral)   Resp 20   Ht 5\' 9"  (1.753 m)   Wt 90.7 kg   SpO2 99%   BMI 29.53 kg/m   Physical Exam Vitals and nursing note reviewed.  Constitutional:      General: Robert Neal is not in acute distress.    Appearance: Normal appearance. Robert Neal is not ill-appearing, toxic-appearing or diaphoretic.  HENT:     Head: Normocephalic.  Eyes:     Conjunctiva/sclera: Conjunctivae normal.  Pulmonary:     Effort: Pulmonary effort is normal.  Musculoskeletal:     Comments: No Point bony tenderness of the left shoulder.  Elbow and  wrist are normal.  Robert Neal has no pain with passive range of motion of the left shoulder.  Robert Neal does have pain to the anterior left bicep with active flexion and extension.  No palpable or obvious bulge or deformity.  No tenderness in the bicipital groove.  Skin:    General: Skin is dry.     Capillary Refill: Capillary refill takes less than 2 seconds.     Findings: No bruising or rash.  Neurological:     Mental Status: Robert Neal is alert.     Sensory: No sensory deficit.  Psychiatric:        Mood and Affect: Mood normal.     ED Results / Procedures / Treatments   Labs (all labs ordered are listed, but only abnormal results are displayed) Labs Reviewed - No data to display  EKG None  Radiology DG Shoulder Left  Result Date: 04/09/2019 CLINICAL DATA:  Status post fall with left shoulder pain. EXAM: LEFT SHOULDER - 2+ VIEW COMPARISON:  None. FINDINGS: There is no evidence of fracture or dislocation. Chronic deformity of the distal left clavicle is identified. Soft tissues are unremarkable. IMPRESSION: No acute fracture or dislocation. Electronically Signed   By: 04/11/2019 M.D.   On: 04/09/2019 12:36    Procedures Procedures (including critical care time)  Medications Ordered in ED Medications  ketorolac (TORADOL) injection 60 mg (has no administration in time range)    ED Course  I have reviewed the triage vital signs and the nursing notes.  Pertinent labs & imaging results that were available during my care of the patient were reviewed by me and considered in my medical decision making (see chart for details).  Clinical Course as of Apr 09 1335  Sun Apr 09, 2019  1335 Patient with tenderness and pain to the biceps muscle after mechanical fall yesterday.  X-rays reassuring.  Likely contusion to the biceps muscle.  Do not suspect a rupture as there is no deformity and Robert Neal still has good movement.  Will treat with NSAIDs and referred to sports medicine.   [KM]    Clinical Course User  Index [KM] 1336   MDM Rules/Calculators/A&P                      Based on review of vitals, medical screening exam, lab work and/or imaging, there does not appear to be an acute, emergent etiology for the patient's symptoms. Counseled pt on good return precautions and encouraged both PCP and ED follow-up as needed.  Prior to discharge, I also discussed incidental imaging findings with patient in detail and advised appropriate, recommended follow-up in detail.  Clinical Impression: 1. Fall, initial encounter   2. Musculoskeletal pain of left upper extremity     Disposition: Discharge  Prior to providing a prescription for a controlled substance, I independently reviewed the patient's recent prescription history on the [place]. The patient had no recent or regular prescriptions and was deemed appropriate for a brief, less than 3 day prescription of narcotic for acute analgesia.  This note was prepared with assistance of Systems analyst. Occasional wrong-word or sound-a-like substitutions may have occurred due to the inherent limitations of voice recognition software.  Final Clinical Impression(s) / ED Diagnoses Final diagnoses:  Fall, initial encounter  Musculoskeletal pain of left upper extremity    Rx / DC Orders ED Discharge Orders         Ordered    naproxen (NAPROSYN) 500 MG tablet  2 times daily     04/09/19 1336    cyclobenzaprine (FLEXERIL) 10 MG tablet  2 times daily PRN     04/09/19 1336           Kristine Royal 04/09/19 1337    Little, Wenda Overland, MD 04/09/19 1523

## 2019-04-13 ENCOUNTER — Ambulatory Visit: Payer: Self-pay

## 2019-04-13 ENCOUNTER — Ambulatory Visit (INDEPENDENT_AMBULATORY_CARE_PROVIDER_SITE_OTHER): Payer: Managed Care, Other (non HMO) | Admitting: Family Medicine

## 2019-04-13 ENCOUNTER — Other Ambulatory Visit: Payer: Self-pay

## 2019-04-13 ENCOUNTER — Encounter: Payer: Self-pay | Admitting: Family Medicine

## 2019-04-13 VITALS — BP 144/98 | HR 75 | Ht 69.0 in | Wt 200.0 lb

## 2019-04-13 DIAGNOSIS — M19012 Primary osteoarthritis, left shoulder: Secondary | ICD-10-CM

## 2019-04-13 DIAGNOSIS — M25512 Pain in left shoulder: Secondary | ICD-10-CM

## 2019-04-13 MED ORDER — TRIAMCINOLONE ACETONIDE 40 MG/ML IJ SUSP
40.0000 mg | Freq: Once | INTRAMUSCULAR | Status: AC
  Administered 2019-04-13: 40 mg via INTRA_ARTICULAR

## 2019-04-13 MED ORDER — HYDROCODONE-ACETAMINOPHEN 5-325 MG PO TABS: 1.0000 | ORAL_TABLET | Freq: Three times a day (TID) | ORAL | 0 refills | Status: DC | PRN

## 2019-04-13 MED ORDER — IBUPROFEN-FAMOTIDINE 800-26.6 MG PO TABS: 1.0000 | ORAL_TABLET | Freq: Three times a day (TID) | ORAL | 3 refills | Status: DC | PRN

## 2019-04-13 NOTE — Assessment & Plan Note (Addendum)
Pain resulted in injury while at work.  Sustained on 3/28.  Had a fall directly onto shoulder.  No fracture on x-ray.  Has an effusion on exam which is likely indicate exacerbation of underlying arthritis or labral pathology. -Injection. -Continue sling. -Counseled on home exercise therapy and supportive care. -Duexis. -Norco and counseled on its use. -Follow-up in 3 weeks.  Will consider physical therapy or further imaging if needed at that time.

## 2019-04-13 NOTE — Progress Notes (Addendum)
Robert Neal - 64 y.o. male MRN [id number]  Date of birth: 1955-01-30  SUBJECTIVE:  Including CC & ROS.  Chief Complaint  Patient presents with  . Arm Injury    left arm x 04/08/2019    Robert Neal is a 64 y.o. male that is presenting with left shoulder pain.  He had an injury where he fell onto his shoulder with his left arm adducted while at work.  He was evaluated in the emergency department on 3/28.  X-rays did not show any significant fracture.  He has been in a sling since that time and pain has been significant.  No history of similar pain or injury.  No history of surgery of the shoulder.  Pain is severe and constant with any motion.  He has limited in his range of motion.  Independent review of the left shoulder x-ray from 3/28 shows mild glenohumeral degenerative changes.   Review of Systems See HPI   HISTORY: Past Medical, Surgical, Social, and Family History Reviewed & Updated per EMR.   Pertinent Historical Findings include:  Past Medical History:  Diagnosis Date  . Anxiety   . BPH (benign prostatic hypertrophy)   . Esophageal reflux   . Hyperlipidemia   . Hypertension   . Insomnia     Past Surgical History:  Procedure Laterality Date  . HERNIA REPAIR      Family History  Problem Relation Age of Onset  . Lung cancer Father        worked with chemicals, chain smoker    Social History   Socioeconomic History  . Marital status: Married    Spouse name: Not on file  . Number of children: Not on file  . Years of education: Not on file  . Highest education level: Not on file  Occupational History  . Not on file  Tobacco Use  . Smoking status: Former Smoker    Packs/day: 1.00    Years: 8.00    Pack years: 8.00    Types: Cigarettes    Quit date: 01/12/2013    Years since quitting: 6.2  . Smokeless tobacco: Never Used  . Tobacco comment: socially smoked x42yrs  Substance and Sexual Activity  . Alcohol use: Yes    Alcohol/week: 0.0 standard drinks   Comment: 10 beers daily  . Drug use: No  . Sexual activity: Not on file  Other Topics Concern  . 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 Difficulty of Paying Living Expenses:   Food Insecurity:   . Worried About Charity fundraiser in the Last Year:   . Arboriculturist in the Last Year:   Transportation Needs:   . Film/video editor (Medical):   Marland Kitchen Lack of Transportation (Non-Medical):   Physical Activity:   . Days of Exercise per Week:   . Minutes of Exercise per Session:   Stress:   . Feeling of Stress :   Social Connections:   . Frequency of Communication with Friends and Family:   . Frequency of Social Gatherings with Friends and Family:   . Attends Religious Services:   . Active Member of Clubs or Organizations:   . Attends Archivist Meetings:   Marland Kitchen Marital Status:   Intimate Partner Violence:   . Fear of Current  or Ex-Partner:   . Emotionally Abused:   Marland Kitchen Physically Abused:   . Sexually Abused:      PHYSICAL EXAM:  VS: BP (!) 144/98   Pulse 75   Ht 5\' 9"  (1.753 m)   Wt 200 lb (90.7 kg)   BMI 29.53 kg/m  Physical Exam Gen: NAD, alert, cooperative with exam, well-appearing MSK:  Left shoulder: Unable to abduct without severe pain or externally rotate. Limited external rotation. Internal rotation is normal. Exam limited secondary to pain. Neurovascularly intact.  Limited ultrasound: Left shoulder:  Normal-appearing biceps tendon while in short axis. Supraspinatus intact.  There is an overlying effusion extending down to the humeral head.. Significant effusion within the posterior glenohumeral joint.   Summary: Findings with suggestive effusion of the joint.  No fracture identified.  Ultrasound and interpretation by , MD   Aspiration/Injection Procedure Note Steel Kerney [birth year]  Procedure: Injection Indications: Left shoulder pain  Procedure Details Consent: Risks of procedure as well as the alternatives and risks of each were explained to the (patient/caregiver).  Consent for procedure obtained. Time Out: Verified patient identification, verified procedure, site/side was marked, verified correct patient position, special equipment/implants available, medications/allergies/relevent history reviewed, required imaging and test results available.  Performed.  The area was cleaned with iodine and alcohol swabs.    The left glenohumeral joint was injected using 1 cc's of 40 mg Kenalog and 4 cc's of 0.25% bupivacaine with a 21 2" needle.  Ultrasound was used. Images were obtained in short views showing the injection.     A sterile dressing was applied.  Patient did tolerate procedure well.     ASSESSMENT & PLAN:   Primary osteoarthritis of left shoulder Pain resulted in injury while at work.  Sustained on 3/28.  Had a fall directly onto shoulder.  No fracture on x-ray.  Has an effusion on exam which is likely indicate exacerbation of underlying arthritis or labral pathology. -Injection. -Continue sling. -Counseled on home exercise therapy and supportive care. -Duexis. -Norco and counseled on its use. -Follow-up in 3 weeks.  Will consider physical therapy or further imaging if needed at that time.

## 2019-04-13 NOTE — Patient Instructions (Signed)
Nice to meet you Please try ice  Please use the sling for the next two weeks and then as needed Please try the range of motion movements when your pain is decreased  Please use the norco for severe pain  Please use the duexis for moderate pain  Please send me a message in MyChart with any questions or updates.  Please see me back in 3 weeks.   --Dr. Jordan Likes

## 2019-05-02 ENCOUNTER — Ambulatory Visit: Payer: Managed Care, Other (non HMO) | Attending: Internal Medicine

## 2019-05-02 DIAGNOSIS — Z23 Encounter for immunization: Secondary | ICD-10-CM

## 2019-05-02 NOTE — Progress Notes (Signed)
   Covid-19 Vaccination Clinic  Name:  Robert Neal    MRN: [id number] DOB: Apr 24, 1955  05/02/2019  [name] was observed post Covid-19 immunization for 15 minutes without incident. He was provided with Vaccine Information Sheet and instruction to access the V-Safe system.   [name] was instructed to call 911 with any severe reactions post vaccine: Marland Kitchen Difficulty breathing  . Swelling of face and throat  . A fast heartbeat  . A bad rash all over body  . Dizziness and weakness   Immunizations Administered    Name Date Dose VIS Date Route   Pfizer COVID-19 Vaccine 05/02/2019  3:44 PM 0.3 mL 03/08/2018 Intramuscular   Manufacturer: [employer], [employer]   Lot: [id number]   NDC: [address]-7

## 2019-05-03 ENCOUNTER — Ambulatory Visit: Payer: Self-pay

## 2019-05-04 ENCOUNTER — Ambulatory Visit: Payer: Self-pay

## 2019-05-04 ENCOUNTER — Other Ambulatory Visit: Payer: Self-pay

## 2019-05-04 ENCOUNTER — Encounter: Payer: Self-pay | Admitting: Family Medicine

## 2019-05-04 ENCOUNTER — Ambulatory Visit (INDEPENDENT_AMBULATORY_CARE_PROVIDER_SITE_OTHER): Payer: Managed Care, Other (non HMO) | Admitting: Family Medicine

## 2019-05-04 VITALS — BP 126/88 | HR 69 | Ht 69.0 in | Wt 200.0 lb

## 2019-05-04 DIAGNOSIS — M19012 Primary osteoarthritis, left shoulder: Secondary | ICD-10-CM | POA: Diagnosis not present

## 2019-05-04 DIAGNOSIS — M7542 Impingement syndrome of left shoulder: Secondary | ICD-10-CM | POA: Diagnosis not present

## 2019-05-04 MED ORDER — METHYLPREDNISOLONE ACETATE 40 MG/ML IJ SUSP
40.0000 mg | Freq: Once | INTRAMUSCULAR | Status: AC
  Administered 2019-05-04: 40 mg via INTRA_ARTICULAR

## 2019-05-04 NOTE — Assessment & Plan Note (Signed)
Symptoms seem to be more impingement related that he is experiencing now. -Subacromial injection. -Counseled on home exercise therapy and supportive care. -Could consider nitro patches or physical therapy if needed.

## 2019-05-04 NOTE — Assessment & Plan Note (Signed)
Joint pain has improved with the glenohumeral injection. -Counseled on home exercise therapy and supportive care. -Provided work note.

## 2019-05-04 NOTE — Patient Instructions (Signed)
Good to see you Please try ice  Please continue the exercises  You can stop the sling   Please send me a message in MyChart with any questions or updates.  Please see me back in 4-6 weeks or as needed.   --Dr. Jordan Likes

## 2019-05-04 NOTE — Addendum Note (Signed)
Addended by: Kathi Simpers F on: 05/04/2019 08:54 AM   Modules accepted: Orders

## 2019-05-04 NOTE — Progress Notes (Signed)
Robert Neal - 64 y.o. male MRN [id number]  Date of birth: 1955-08-14  SUBJECTIVE:  Including CC & ROS.  Chief Complaint  Patient presents with  . Follow-up    Robert Neal is a 64 y.o. male that is following up for his left shoulder pain.  He reports 75% improvement with the glenohumeral injection.  He still has some pain on the lateral aspect that radiates down the arm.  Pain is intermittent and only notices it with certain abduction movements.   Review of Systems See HPI   HISTORY: Past Medical, Surgical, Social, and Family History Reviewed & Updated per EMR.   Pertinent Historical Findings include:  Past Medical History:  Diagnosis Date  . Anxiety   . BPH (benign prostatic hypertrophy)   . Esophageal reflux   . Hyperlipidemia   . Hypertension   . Insomnia     Past Surgical History:  Procedure Laterality Date  . HERNIA REPAIR      Family History  Problem Relation Age of Onset  . Lung cancer Father        worked with chemicals, chain smoker    Social History   Socioeconomic History  . Marital status: Married    Spouse name: Not on file  . Number of children: Not on file  . Years of education: Not on file  . Highest education level: Not on file  Occupational History  . Not on file  Tobacco Use  . Smoking status: Former Smoker    Packs/day: 1.00    Years: 8.00    Pack years: 8.00    Types: Cigarettes    Quit date: 01/12/2013    Years since quitting: 6.3  . Smokeless tobacco: Never Used  . Tobacco comment: socially smoked x24yrs  Substance and Sexual Activity  . Alcohol use: Yes    Alcohol/week: 0.0 standard drinks    Comment: 10 beers daily  . Drug use: No  . Sexual activity: Not on file  Other Topics Concern  . 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 Difficulty of Paying Living Expenses:   Food  Insecurity:   . Worried About Charity fundraiser in the Last Year:   . Arboriculturist in the Last Year:   Transportation Needs:   . Film/video editor (Medical):   Marland Kitchen Lack of Transportation (Non-Medical):   Physical Activity:   . Days of Exercise per Week:   . Minutes of Exercise per Session:   Stress:   . Feeling of Stress :   Social Connections:   . Frequency of Communication with Friends and Family:   . Frequency of Social Gatherings with Friends and Family:   . Attends Religious Services:   . Active Member of Clubs or Organizations:   . Attends Archivist Meetings:   Marland Kitchen Marital Status:   Intimate Partner Violence:   . Fear of Current or Ex-Partner:   . Emotionally Abused:   Marland Kitchen Physically Abused:   . Sexually Abused:      PHYSICAL EXAM:  VS: BP 126/88   Pulse 69   Ht 5\' 9"  (1.753 m)   Wt 200 lb (90.7 kg)   BMI 29.53 kg/m  Physical Exam Gen: NAD, alert, cooperative with exam, well-appearing MSK:  Left shoulder: Normal active flexion abduction.  Normal internal and external rotation. Some pain with abduction and empty can testing. Neurovascularly intact    Aspiration/Injection Procedure Note Robert Neal 07-22-1955  Procedure: Injection Indications: Left shoulder pain  Procedure Details Consent: Risks of procedure as well as the alternatives and risks of each were explained to the (patient/caregiver).  Consent for procedure obtained. Time Out: Verified patient identification, verified procedure, site/side was marked, verified correct patient position, special equipment/implants available, medications/allergies/relevent history reviewed, required imaging and test results available.  Performed.  The area was cleaned with iodine and alcohol swabs.    The left subacromial space was injected using 1 cc's of 40 mg Depo-Medrol and 4 cc's of 0.25% bupivacaine with a 22 1 1/2" needle.  Ultrasound was used. Images were obtained in long views showing the  injection.     A sterile dressing was applied.  Patient did tolerate procedure well.    ASSESSMENT & PLAN:   Primary osteoarthritis of left shoulder Joint pain has improved with the glenohumeral injection. -Counseled on home exercise therapy and supportive care. -Provided work note.  Subacromial impingement of left shoulder Symptoms seem to be more impingement related that he is experiencing now. -Subacromial injection. -Counseled on home exercise therapy and supportive care. -Could consider nitro patches or physical therapy if needed.

## 2019-08-31 ENCOUNTER — Ambulatory Visit: Payer: Self-pay

## 2019-08-31 ENCOUNTER — Other Ambulatory Visit: Payer: Self-pay

## 2019-08-31 ENCOUNTER — Ambulatory Visit (INDEPENDENT_AMBULATORY_CARE_PROVIDER_SITE_OTHER): Payer: Managed Care, Other (non HMO) | Admitting: Family Medicine

## 2019-08-31 ENCOUNTER — Encounter: Payer: Self-pay | Admitting: Family Medicine

## 2019-08-31 VITALS — BP 145/91 | HR 90 | Ht 69.0 in | Wt 205.0 lb

## 2019-08-31 DIAGNOSIS — M19012 Primary osteoarthritis, left shoulder: Secondary | ICD-10-CM | POA: Diagnosis not present

## 2019-08-31 MED ORDER — HYDROCODONE-ACETAMINOPHEN 5-325 MG PO TABS: 1.0000 | ORAL_TABLET | Freq: Three times a day (TID) | ORAL | 0 refills | Status: DC | PRN

## 2019-08-31 MED ORDER — TRIAMCINOLONE ACETONIDE 40 MG/ML IJ SUSP
40.0000 mg | Freq: Once | INTRAMUSCULAR | Status: AC
  Administered 2019-08-31: 40 mg via INTRA_ARTICULAR

## 2019-08-31 NOTE — Patient Instructions (Signed)
Good to see you Please use ice  Please try the exercises   Please send me a message in MyChart with any questions or updates.  Please see me back in 4 weeks or sooner if needed.   --Dr. Jordan Likes

## 2019-08-31 NOTE — Assessment & Plan Note (Signed)
acute exacerbation of underlying degenerative changes of the joint.  Seems less likely for adhesive capsulitis at this point. -Counseled on home exercise therapy and supportive care. -Injection. -Provided work note. -Norco for severe pain. -Could consider physical therapy.

## 2019-08-31 NOTE — Progress Notes (Signed)
Robert Neal - 64 y.o. male MRN [id number]  Date of birth: [birth year]  SUBJECTIVE:  Including CC & ROS.  Chief Complaint  Patient presents with  . Shoulder Pain    left shoulder    Robert Neal is a 64 y.o. male that is presenting with acute left shoulder pain.  He is having severe pain.  He denies any specific inciting event.  Pain is worse with external rotation abduction.  Is localized to the shoulder.   Review of Systems See HPI   HISTORY: Past Medical, Surgical, Social, and Family History Reviewed & Updated per EMR.   Pertinent Historical Findings include:  Past Medical History:  Diagnosis Date  . Anxiety   . BPH (benign prostatic hypertrophy)   . Esophageal reflux   . Hyperlipidemia   . Hypertension   . Insomnia     Past Surgical History:  Procedure Laterality Date  . HERNIA REPAIR      Family History  Problem Relation Age of Onset  . Lung cancer Father        worked with chemicals, chain smoker    Social History   Socioeconomic History  . Marital status: Married    Spouse name: Not on file  . Number of children: Not on file  . Years of education: Not on file  . Highest education level: Not on file  Occupational History  . Not on file  Tobacco Use  . Smoking status: Former Smoker    Packs/day: 1.00    Years: 8.00    Pack years: 8.00    Types: Cigarettes    Quit date: 01/12/2013    Years since quitting: 6.6  . Smokeless tobacco: Never Used  . Tobacco comment: socially smoked x13yrs  Substance and Sexual Activity  . Alcohol use: Yes    Alcohol/week: 0.0 standard drinks    Comment: 10 beers daily  . Drug use: No  . Sexual activity: Not on file  Other Topics Concern  . 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 Difficulty of Paying Living Expenses: Not on file  Food Insecurity:   . Worried About Community education officer in the Last Year: Not on file  . Ran Out of Food in the Last Year: Not on file  Transportation Needs:   . Lack of Transportation (Medical): Not on file  . Lack of Transportation (Non-Medical): Not on file  Physical Activity:   . Days of Exercise per Week: Not on file  . Minutes of Exercise per Session: Not on file  Stress:   . Feeling of Stress : Not on file  Social Connections:   . Frequency of Communication with Friends and Family: Not on file  . Frequency of Social Gatherings with Friends and Family: Not on file  . Attends Religious Services: Not on file  . Active Member of Clubs or Organizations: Not on file  . Attends Banker Meetings: Not on file  . Marital Status: Not on file  Intimate Partner Violence:   . Fear of Current or Ex-Partner: Not on file  . Emotionally Abused: Not on file  . Physically Abused: Not on file  . Sexually Abused: Not on file     PHYSICAL EXAM:  VS: BP (!) 145/91   Pulse 90   Ht 5'  9" (1.753 m)   Wt 205 lb (93 kg)   BMI 30.27 kg/m  Physical Exam Gen: NAD, alert, cooperative with exam, well-appearing MSK:  Left shoulder: Limited external rotation. Pain with external rotation abduction. Normal internal rotation. Neurovascular intact   Aspiration/Injection Procedure Note Kaelen Caughlin 1955/10/14  Procedure: Injection Indications: Left shoulder pain  Procedure Details Consent: Risks of procedure as well as the alternatives and risks of each were explained to the (patient/caregiver).  Consent for procedure obtained. Time Out: Verified patient identification, verified procedure, site/side was marked, verified correct patient position, special equipment/implants available, medications/allergies/relevent history reviewed, required imaging and test results available.  Performed.  The area was cleaned with iodine and alcohol swabs.    The left glenohumeral joint was injected using 1 cc's of 40 mg Kenalog and 5 cc's of 0.25%  bupivacaine with a 22 3 1/2" needle.  Ultrasound was used. Images were obtained in short views showing the injection.     A sterile dressing was applied.  Patient did tolerate procedure well.     ASSESSMENT & PLAN:   Primary osteoarthritis of left shoulder acute exacerbation of underlying degenerative changes of the joint.  Seems less likely for adhesive capsulitis at this point. -Counseled on home exercise therapy and supportive care. -Injection. -Provided work note. -Norco for severe pain. -Could consider physical therapy.

## 2019-09-12 ENCOUNTER — Ambulatory Visit (INDEPENDENT_AMBULATORY_CARE_PROVIDER_SITE_OTHER): Payer: Managed Care, Other (non HMO) | Admitting: Family Medicine

## 2019-09-12 ENCOUNTER — Ambulatory Visit (HOSPITAL_BASED_OUTPATIENT_CLINIC_OR_DEPARTMENT_OTHER)
Admission: RE | Admit: 2019-09-12 | Discharge: 2019-09-12 | Disposition: A | Discharge status: Home / Self Care | Payer: Managed Care, Other (non HMO) | Source: Ambulatory Visit | Attending: Family Medicine | Admitting: Family Medicine

## 2019-09-12 ENCOUNTER — Other Ambulatory Visit: Payer: Self-pay

## 2019-09-12 ENCOUNTER — Encounter: Payer: Self-pay | Admitting: Family Medicine

## 2019-09-12 VITALS — BP 152/105 | HR 79 | Ht 69.0 in | Wt 210.0 lb

## 2019-09-12 DIAGNOSIS — R0781 Pleurodynia: Secondary | ICD-10-CM | POA: Insufficient documentation

## 2019-09-12 DIAGNOSIS — S2231XA Fracture of one rib, right side, initial encounter for closed fracture: Secondary | ICD-10-CM | POA: Diagnosis not present

## 2019-09-12 MED ORDER — HYDROCODONE-ACETAMINOPHEN 5-325 MG PO TABS
1.0000 | ORAL_TABLET | Freq: Three times a day (TID) | ORAL | 0 refills | Status: DC | PRN
Start: 2019-09-12 — End: 2022-09-15

## 2019-09-12 MED ORDER — IBUPROFEN 600 MG PO TABS: 600.0000 mg | ORAL_TABLET | Freq: Three times a day (TID) | ORAL | 1 refills | Status: DC | PRN

## 2019-09-12 MED ORDER — KETOROLAC TROMETHAMINE 30 MG/ML IJ SOLN
30.0000 mg | Freq: Once | INTRAMUSCULAR | Status: AC
  Administered 2019-09-12: 30 mg via INTRAMUSCULAR

## 2019-09-12 MED FILL — IBUPROFEN 600 MG TABLET: 600 | 20 days supply | Qty: 60 | Fill #0

## 2019-09-12 MED FILL — HYDROCODON-APAP 5-325: 5-325 | 5 days supply | Qty: 15 | Fill #0

## 2019-09-12 NOTE — Progress Notes (Signed)
Robert Neal - 64 y.o. male MRN [id number]  Date of birth: May 21, 1955  SUBJECTIVE:  Including CC & ROS.  Chief Complaint  Patient presents with  . Back Injury    low back    Robert Neal is a 64 y.o. male that is presenting with acute right wrist pain.  He fell on a boat landed on the dock this past weekend.  He is having severe pain in the mid axillary region.  He has trouble with deep breathing as well as getting up from seated position.  The pain is sharp and more constant.   Review of Systems See HPI   HISTORY: Past Medical, Surgical, Social, and Family History Reviewed & Updated per EMR.   Pertinent Historical Findings include:  Past Medical History:  Diagnosis Date  . Anxiety   . BPH (benign prostatic hypertrophy)   . Esophageal reflux   . Hyperlipidemia   . Hypertension   . Insomnia     Past Surgical History:  Procedure Laterality Date  . HERNIA REPAIR      Family History  Problem Relation Age of Onset  . Lung cancer Father        worked with chemicals, chain smoker    Social History   Socioeconomic History  . Marital status: Married    Spouse name: Not on file  . Number of children: Not on file  . Years of education: Not on file  . Highest education level: Not on file  Occupational History  . Not on file  Tobacco Use  . Smoking status: Former Smoker    Packs/day: 1.00    Years: 8.00    Pack years: 8.00    Types: Cigarettes    Quit date: 01/12/2013    Years since quitting: 6.6  . Smokeless tobacco: Never Used  . Tobacco comment: socially smoked x65yrs  Substance and Sexual Activity  . Alcohol use: Yes    Alcohol/week: 0.0 standard drinks    Comment: 10 beers daily  . Drug use: No  . Sexual activity: Not on file  Other Topics Concern  . 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 Difficulty of Paying  Living Expenses: Not on file  Food Insecurity:   . Worried About Programme researcher, broadcasting/film/video in the Last Year: Not on file  . Ran Out of Food in the Last Year: Not on file  Transportation Needs:   . Lack of Transportation (Medical): Not on file  . Lack of Transportation (Non-Medical): Not on file  Physical Activity:   . Days of Exercise per Week: Not on file  . Minutes of Exercise per Session: Not on file  Stress:   . Feeling of Stress : Not on file  Social Connections:   . Frequency of Communication with Friends and Family: Not on file  . Frequency of Social Gatherings with Friends and Family: Not on file  . Attends Religious Services: Not on file  . Active Member of Clubs or Organizations: Not on file  . Attends Banker Meetings: Not on file  . Marital Status: Not on file  Intimate Partner Violence:   . Fear of Current or Ex-Partner: Not on file  . Emotionally Abused: Not on file  . Physically Abused: Not on file  . Sexually Abused: Not on  file     PHYSICAL EXAM:  VS: BP (!) 152/105   Pulse 79   Ht 5\' 9"  (1.753 m)   Wt 210 lb (95.3 kg)   BMI 31.01 kg/m  Physical Exam Gen: NAD, alert, cooperative with exam, well-appearing MSK:  Right ribs: No crepitus. Tenderness to palpation of mid axillary line around the ninth rib. No ecchymosis. No shortness of breath. Neurovascularly intact     ASSESSMENT & PLAN:   Closed fracture of one rib of right side Injury occurred this past weekend.  Independent review of the rib x-rays shows a mildly displaced rib fracture. -IM Toradol. -Ibuprofen and Norco. -Counseled on compression. -Given indications on follow-up

## 2019-09-12 NOTE — Patient Instructions (Signed)
Good to see you  Please try ice  Please start ibuprofen around 8 pm  Please use norco for severe pain  Please try compressino  Please send me a message in MyChart with any questions or updates.  Please see me back in 4 weeks.   --Dr. Jordan Likes

## 2019-09-13 NOTE — Assessment & Plan Note (Signed)
Injury occurred this past weekend.  Independent review of the rib x-rays shows a mildly displaced rib fracture. -IM Toradol. -Ibuprofen and Norco. -Counseled on compression. -Given indications on follow-up

## 2019-10-17 ENCOUNTER — Ambulatory Visit (INDEPENDENT_AMBULATORY_CARE_PROVIDER_SITE_OTHER): Payer: Managed Care, Other (non HMO) | Admitting: Family Medicine

## 2019-10-17 ENCOUNTER — Encounter: Payer: Self-pay | Admitting: Family Medicine

## 2019-10-17 ENCOUNTER — Ambulatory Visit: Payer: Self-pay

## 2019-10-17 ENCOUNTER — Other Ambulatory Visit: Payer: Self-pay

## 2019-10-17 VITALS — BP 130/94 | HR 89 | Ht 69.0 in | Wt 205.0 lb

## 2019-10-17 DIAGNOSIS — S2231XD Fracture of one rib, right side, subsequent encounter for fracture with routine healing: Secondary | ICD-10-CM

## 2019-10-17 DIAGNOSIS — M7582 Other shoulder lesions, left shoulder: Secondary | ICD-10-CM | POA: Diagnosis not present

## 2019-10-17 MED ORDER — METHYLPREDNISOLONE ACETATE 40 MG/ML IJ SUSP
40.0000 mg | Freq: Once | INTRAMUSCULAR | Status: AC
  Administered 2019-10-17: 40 mg via INTRA_ARTICULAR

## 2019-10-17 MED ORDER — PENNSAID 2 % EX SOLN: 1.0000 "application " | Freq: Two times a day (BID) | CUTANEOUS | 2 refills | Status: DC

## 2019-10-17 NOTE — Progress Notes (Signed)
Robert Neal - 64 y.o. male MRN [id number]  Date of birth: 1955-03-10  SUBJECTIVE:  Including CC & ROS.  Chief Complaint  Patient presents with  . Follow-up    right-side ribs    Robert Neal is a 64 y.o. male that is presenting with exacerbation of his left shoulder pain and functional shoulder pain.  Patient pain is fairly controlled.  The left shoulder is aggravated in certain positions.  He feels it most when he is stressed out.   Review of Systems See HPI   HISTORY: Past Medical, Surgical, Social, and Family History Reviewed & Updated per EMR.   Pertinent Historical Findings include:  Past Medical History:  Diagnosis Date  . Anxiety   . BPH (benign prostatic hypertrophy)   . Esophageal reflux   . Hyperlipidemia   . Hypertension   . Insomnia     Past Surgical History:  Procedure Laterality Date  . HERNIA REPAIR      Family History  Problem Relation Age of Onset  . Lung cancer Father        worked with chemicals, chain smoker    Social History   Socioeconomic History  . Marital status: Married    Spouse name: Not on file  . Number of children: Not on file  . Years of education: Not on file  . Highest education level: Not on file  Occupational History  . Not on file  Tobacco Use  . Smoking status: Former Smoker    Packs/day: 1.00    Years: 8.00    Pack years: 8.00    Types: Cigarettes    Quit date: 01/12/2013    Years since quitting: 6.7  . Smokeless tobacco: Never Used  . Tobacco comment: socially smoked x51yrs  Substance and Sexual Activity  . Alcohol use: Yes    Alcohol/week: 0.0 standard drinks    Comment: 10 beers daily  . Drug use: No  . Sexual activity: Not on file  Other Topics Concern  . 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 Difficulty of Paying Living Expenses: Not on file  Food Insecurity:    . Worried About Programme researcher, broadcasting/film/video in the Last Year: Not on file  . Ran Out of Food in the Last Year: Not on file  Transportation Needs:   . Lack of Transportation (Medical): Not on file  . Lack of Transportation (Non-Medical): Not on file  Physical Activity:   . Days of Exercise per Week: Not on file  . Minutes of Exercise per Session: Not on file  Stress:   . Feeling of Stress : Not on file  Social Connections:   . Frequency of Communication with Friends and Family: Not on file  . Frequency of Social Gatherings with Friends and Family: Not on file  . Attends Religious Services: Not on file  . Active Member of Clubs or Organizations: Not on file  . Attends Banker Meetings: Not on file  . Marital Status: Not on file  Intimate Partner Violence:   . Fear of Current or Ex-Partner: Not on file  . Emotionally Abused: Not on file  . Physically Abused: Not on file  . Sexually Abused: Not on file     PHYSICAL EXAM:  VS: BP (!) 130/94   Pulse 89  Ht 5\' 9"  (1.753 m)   Wt 205 lb (93 kg)   BMI 30.27 kg/m  Physical Exam Gen: NAD, alert, cooperative with exam, well-appearing MSK:  Left shoulder: Normal internal and external rotation. Normal flexion. Pain with empty can testing and internal rotation. Neurovascularly intact   Aspiration/Injection Procedure Note Robert Neal 1955-11-17  Procedure: Injection Indications: Left shoulder pain  Procedure Details Consent: Risks of procedure as well as the alternatives and risks of each were explained to the (patient/caregiver).  Consent for procedure obtained. Time Out: Verified patient identification, verified procedure, site/side was marked, verified correct patient position, special equipment/implants available, medications/allergies/relevent history reviewed, required imaging and test results available.  Performed.  The area was cleaned with iodine and alcohol swabs.    The left subacromial space was injected using 1  cc's of 40 mg Depo-Medrol and 4 cc's of 0.25% bupivacaine with a 22 1 1/2" needle.  Ultrasound was used. Images were obtained in long views showing the injection.     A sterile dressing was applied.  Patient did tolerate procedure well.     ASSESSMENT & PLAN:   Closed fracture of one rib of right side Improvement with overall pain.  Only notices it in certain instances. -Counseled supportive care. -Follow-up as needed.  Rotator cuff tendinitis, left Symptoms seem more consistent with tendinitis at this point.  Has had some capsular issues in the past. -Counseled on home exercise therapy and supportive care. -Subacromial injection. -Could consider physical therapy versus further imaging.

## 2019-10-17 NOTE — Patient Instructions (Signed)
Good to see you Please use ice as needed  Please continue the exercises   Please send me a message in MyChart with any questions or updates.  Please see me back in 4-6 weeks or as needed if better.   --Dr. Aleesa Sweigert  

## 2019-10-18 DIAGNOSIS — M7582 Other shoulder lesions, left shoulder: Secondary | ICD-10-CM | POA: Insufficient documentation

## 2019-10-18 NOTE — Assessment & Plan Note (Signed)
Improvement with overall pain.  Only notices it in certain instances. -Counseled supportive care. -Follow-up as needed.

## 2019-10-18 NOTE — Assessment & Plan Note (Signed)
Symptoms seem more consistent with tendinitis at this point.  Has had some capsular issues in the past. -Counseled on home exercise therapy and supportive care. -Subacromial injection. -Could consider physical therapy versus further imaging.

## 2020-02-05 ENCOUNTER — Other Ambulatory Visit: Payer: Self-pay

## 2020-02-05 ENCOUNTER — Emergency Department (HOSPITAL_COMMUNITY)
Admission: EM | Admit: 2020-02-05 | Discharge: 2020-02-05 | Disposition: A | Discharge status: Home / Self Care | Payer: BC Managed Care – PPO | Attending: Emergency Medicine | Admitting: Emergency Medicine

## 2020-02-05 ENCOUNTER — Emergency Department (HOSPITAL_COMMUNITY): Payer: BC Managed Care – PPO

## 2020-02-05 DIAGNOSIS — R509 Fever, unspecified: Secondary | ICD-10-CM | POA: Insufficient documentation

## 2020-02-05 DIAGNOSIS — R519 Headache, unspecified: Secondary | ICD-10-CM | POA: Diagnosis present

## 2020-02-05 DIAGNOSIS — R0981 Nasal congestion: Secondary | ICD-10-CM | POA: Insufficient documentation

## 2020-02-05 DIAGNOSIS — R079 Chest pain, unspecified: Secondary | ICD-10-CM | POA: Diagnosis not present

## 2020-02-05 DIAGNOSIS — Z79899 Other long term (current) drug therapy: Secondary | ICD-10-CM | POA: Diagnosis not present

## 2020-02-05 DIAGNOSIS — R1110 Vomiting, unspecified: Secondary | ICD-10-CM | POA: Insufficient documentation

## 2020-02-05 DIAGNOSIS — R059 Cough, unspecified: Secondary | ICD-10-CM | POA: Diagnosis not present

## 2020-02-05 DIAGNOSIS — Z87891 Personal history of nicotine dependence: Secondary | ICD-10-CM | POA: Insufficient documentation

## 2020-02-05 DIAGNOSIS — I10 Essential (primary) hypertension: Secondary | ICD-10-CM | POA: Diagnosis not present

## 2020-02-05 DIAGNOSIS — Z20822 Contact with and (suspected) exposure to covid-19: Secondary | ICD-10-CM | POA: Diagnosis not present

## 2020-02-05 DIAGNOSIS — R197 Diarrhea, unspecified: Secondary | ICD-10-CM | POA: Insufficient documentation

## 2020-02-05 MED ORDER — IBUPROFEN 200 MG PO TABS
400.0000 mg | ORAL_TABLET | Freq: Once | ORAL | Status: AC
  Administered 2020-02-05: 400 mg via ORAL
  Filled 2020-02-05: qty 2

## 2020-02-05 MED ORDER — BENZONATATE 100 MG PO CAPS: 100.0000 mg | ORAL_CAPSULE | Freq: Three times a day (TID) | ORAL | 0 refills | Status: DC | PRN

## 2020-02-05 NOTE — ED Triage Notes (Signed)
Patient reports to the ER for COVID symptoms. Patient reports he has "everything but a fever"

## 2020-02-05 NOTE — ED Provider Notes (Signed)
[facility]-EMERGENCY DEPT Provider Note   CSN: [id number] Arrival date & time: 02/05/20  6606     History Chief Complaint  Patient presents with  . COVID symptoms    Robert Neal is a 65 y.o. male.  The history is provided by the patient and medical records.   Robert Neal is a 65 y.o. male who presents to the Emergency Department complaining of covid. He got sick five days ago.  He reports headache, runny nose, cough.  Fever started today.  Sxs are moderate, constant, worsening.   Vomited once.  Has diarrhea.  Has chest pain.  Has sob.  Can drink fluids but is drinking less.    Has a hx/o HTN, is compliant with medications.    Has been vaccinated for COVID 19, not boosted.      Past Medical History:  Diagnosis Date  . Anxiety   . BPH (benign prostatic hypertrophy)   . Esophageal reflux   . Hyperlipidemia   . Hypertension   . Insomnia     Patient Active Problem List   Diagnosis Date Noted  . Rotator cuff tendinitis, left 10/18/2019  . Closed fracture of one rib of right side 09/12/2019  . Subacromial impingement of left shoulder 05/04/2019  . Primary osteoarthritis of left shoulder 04/13/2019  . Hypertension 10/18/2011  . Cardiovascular risk factor 10/18/2011    Past Surgical History:  Procedure Laterality Date  . HERNIA REPAIR         Family History  Problem Relation Age of Onset  . Lung cancer Father        worked with chemicals, chain smoker    Social History   Tobacco Use  . Smoking status: Former Smoker    Packs/day: 1.00    Years: 8.00    Pack years: 8.00    Types: Cigarettes    Quit date: 01/12/2013    Years since quitting: 7.0  . Smokeless tobacco: Never Used  . Tobacco comment: socially smoked x86yrs  Substance Use Topics  . Alcohol use: Yes    Alcohol/week: 0.0 standard drinks    Comment: 10 beers daily  . Drug use: No    Home Medications Prior to Admission medications   Medication Sig Start Date End Date  Taking? Authorizing Provider  benzonatate (TESSALON) 100 MG capsule Take 1 capsule (100 mg total) by mouth 3 (three) times daily as needed for cough. 02/05/20  Yes Tilden Fossa, MD  albuterol (PROVENTIL HFA;VENTOLIN HFA) 108 (90 Base) MCG/ACT inhaler Inhale 2 puffs into the lungs every 6 (six) hours as needed for wheezing or shortness of breath. 04/17/15   Mannam, Colbert Coyer, MD  amLODipine (NORVASC) 10 MG tablet Take 1 tablet (10 mg total) by mouth daily. 10/16/11   Laurey Morale, MD  Diclofenac Sodium (PENNSAID) 2 % SOLN Place 1 application onto the skin 2 (two) times daily. 10/17/19   Myra Rude, MD  hydrochlorothiazide (MICROZIDE) 12.5 MG capsule Take 12.5 mg by mouth daily.    [provider]  HYDROcodone-acetaminophen (NORCO/VICODIN) 5-325 MG tablet Take 1 tablet by mouth every 8 (eight) hours as needed. 09/12/19   Myra Rude, MD  ibuprofen (ADVIL) 600 MG tablet Take 1 tablet (600 mg total) by mouth every 8 (eight) hours as needed. 09/12/19   Myra Rude, MD  Ibuprofen-Famotidine 800-26.6 MG TABS Take 1 tablet by mouth 3 (three) times daily as needed. 04/13/19   Myra Rude, MD  methylPREDNISolone (MEDROL DOSEPAK) 4 MG TBPK tablet  6 day dose pack - take as directed 04/27/17   Hyatt, Max T, DPM  naproxen (NAPROSYN) 500 MG tablet Take 1 tablet (500 mg total) by mouth 2 (two) times daily. 04/09/19   Arlyn Dunning, PA-C  omeprazole (PRILOSEC) 20 MG capsule Take 20 mg by mouth daily.    [provider]  zolpidem (AMBIEN) 10 MG tablet Take 10 mg by mouth at bedtime as needed for sleep.    [provider]    Allergies    Acetaminophen  Review of Systems   Review of Systems  All other systems reviewed and are negative.   Physical Exam Updated Vital Signs BP (!) 129/106 (BP Location: Right Arm)   Pulse (!) 104   Temp (!) 101 F (38.3 C) (Oral)   Resp 16   Ht 5\' 9"  (1.753 m)   Wt 92.8 kg   SpO2 97%   BMI 30.21 kg/m   Physical Exam Vitals  and nursing note reviewed.  Constitutional:      Appearance: He is well-developed and well-nourished.  HENT:     Head: Normocephalic and atraumatic.  Cardiovascular:     Rate and Rhythm: Normal rate and regular rhythm.     Heart sounds: No murmur heard.   Pulmonary:     Effort: Pulmonary effort is normal. No respiratory distress.     Breath sounds: Normal breath sounds.  Abdominal:     Palpations: Abdomen is soft.     Tenderness: There is no abdominal tenderness. There is no guarding or rebound.  Musculoskeletal:        General: No swelling, tenderness or edema.  Skin:    General: Skin is warm and dry.  Neurological:     Mental Status: He is alert and oriented to person, place, and time.  Psychiatric:        Mood and Affect: Mood and affect normal.        Behavior: Behavior normal.     ED Results / Procedures / Treatments   Labs (all labs ordered are listed, but only abnormal results are displayed) Labs Reviewed - No data to display  EKG None  Radiology DG Chest 2 View  Result Date: 02/05/2020 CLINICAL DATA:  Cough, COVID suspected EXAM: CHEST - 2 VIEW COMPARISON:  09/12/2019 FINDINGS: Heart and mediastinal contours are within normal limits. No focal opacities or effusions. No acute bony abnormality. IMPRESSION: No active cardiopulmonary disease. Electronically Signed   By: 09/14/2019 M.D.   On: 02/05/2020 17:29    Procedures Procedures   Medications Ordered in ED Medications  ibuprofen (ADVIL) tablet 400 mg (400 mg Oral Given 02/05/20 2030)    ED Course  I have reviewed the triage vital signs and the nursing notes.  Pertinent labs & imaging results that were available during my care of the patient were reviewed by me and considered in my medical decision making (see chart for details).    MDM Rules/Calculators/A&P                         patient here for evaluation of fever, body aches, cough. He had one episode of emesis. He is non-toxic appearing and well  hydrated on evaluation. No clinical evidence of pneumonia or serious bacterial infection. He did have an outpatient COVID 19 test performed today, results are not currently available. Discussed with patient suspicion of for COVID-19 infection. Discussed symptomatic home care, outpatient follow-up and return precautions.  Final Clinical Impression(s) /  ED Diagnoses Final diagnoses:  Suspected COVID-19 virus infection    Rx / DC Orders ED Discharge Orders         Ordered    benzonatate (TESSALON) 100 MG capsule  3 times daily PRN        02/05/20 2014           Tilden Fossa, MD 02/05/20 2225

## 2020-03-20 DIAGNOSIS — K219 Gastro-esophageal reflux disease without esophagitis: Secondary | ICD-10-CM | POA: Insufficient documentation

## 2020-03-20 DIAGNOSIS — R053 Chronic cough: Secondary | ICD-10-CM | POA: Insufficient documentation

## 2020-03-20 DIAGNOSIS — R0602 Shortness of breath: Secondary | ICD-10-CM | POA: Insufficient documentation

## 2020-03-20 DIAGNOSIS — J984 Other disorders of lung: Secondary | ICD-10-CM | POA: Insufficient documentation

## 2020-11-11 DIAGNOSIS — K5989 Other specified functional intestinal disorders: Secondary | ICD-10-CM | POA: Insufficient documentation

## 2021-05-29 IMAGING — CR DG SHOULDER 2+V*L*
3 series · 3 of 3 positions shown · non-contrast
Comparison: None.

CLINICAL DATA: Status post fall with left shoulder pain.

EXAM:
LEFT SHOULDER - 2+ VIEW

[w shoulder grashey left]
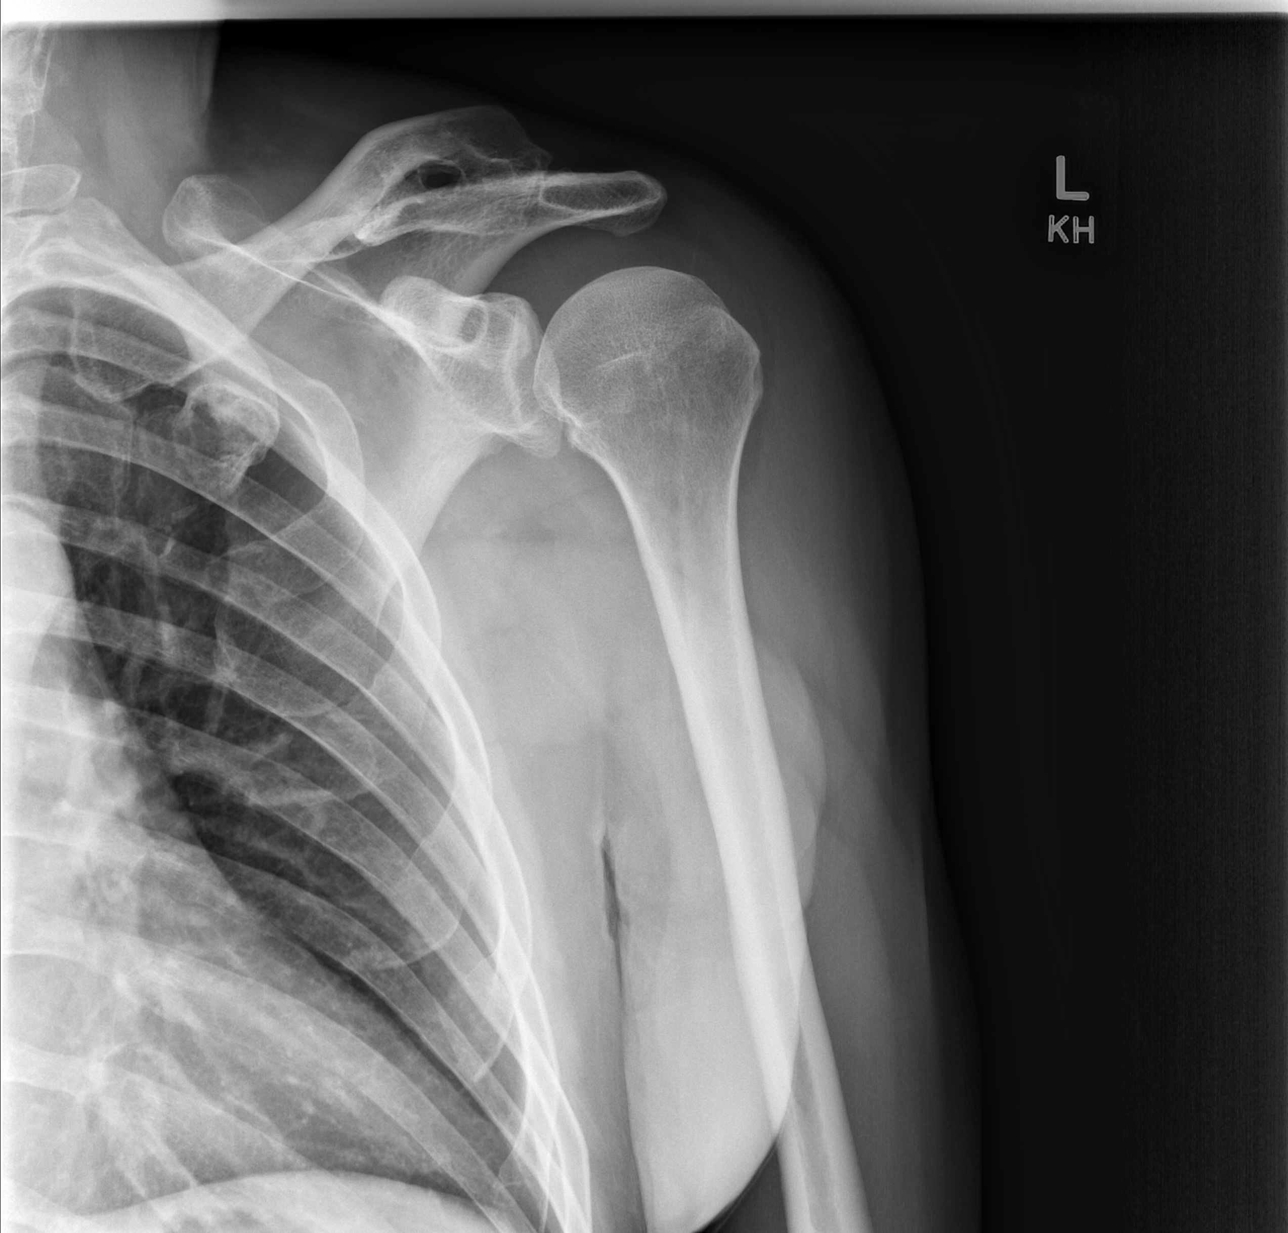

[w shoulder y view left]
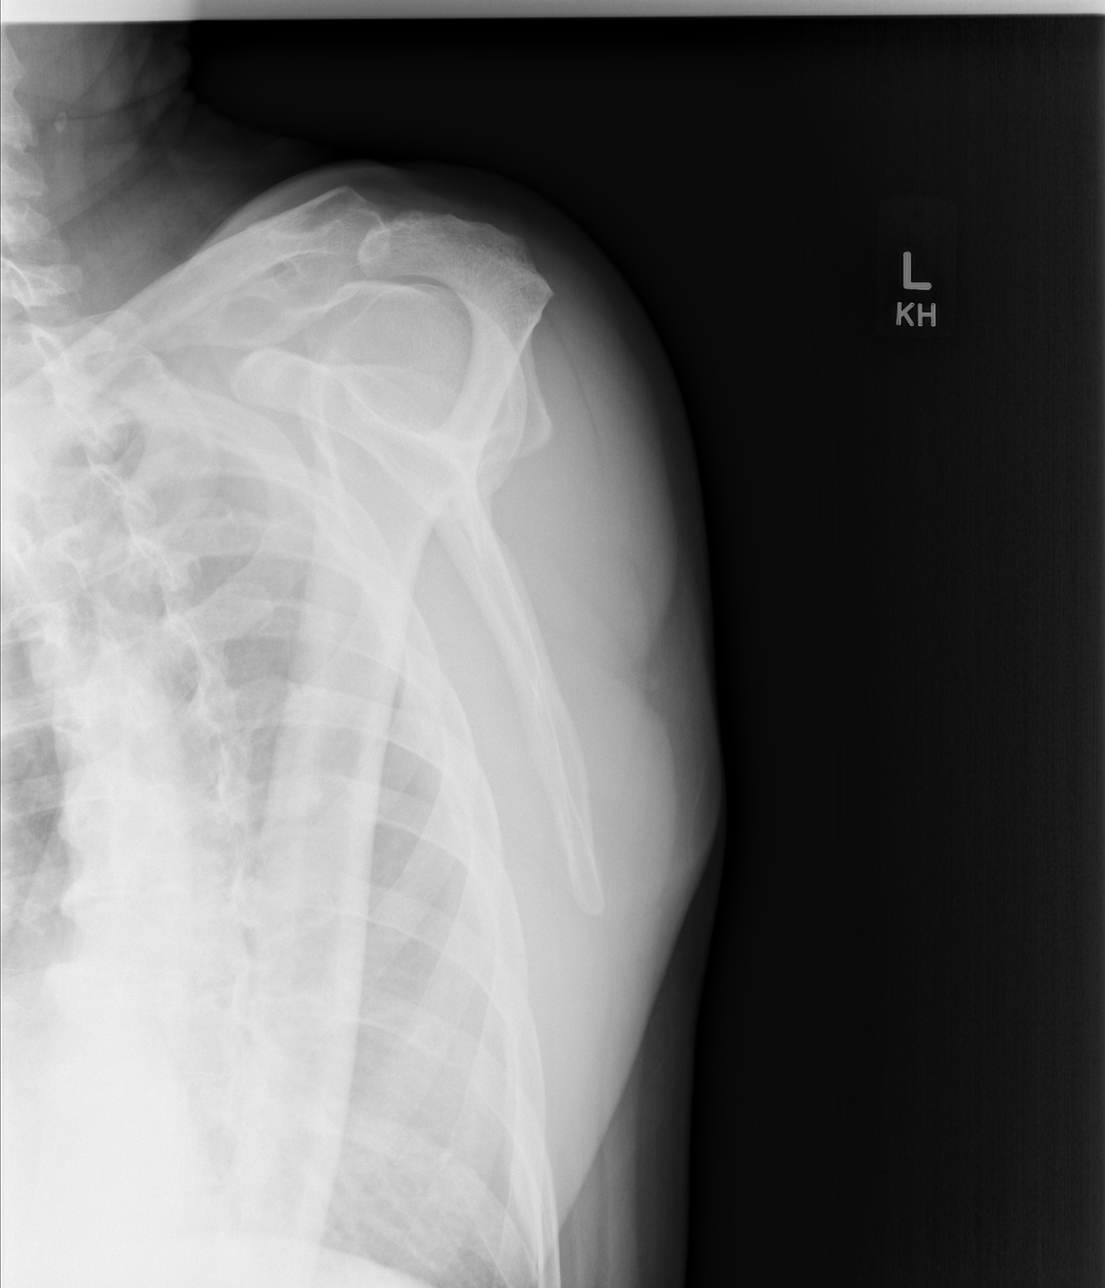

[x shoulder axillary left]
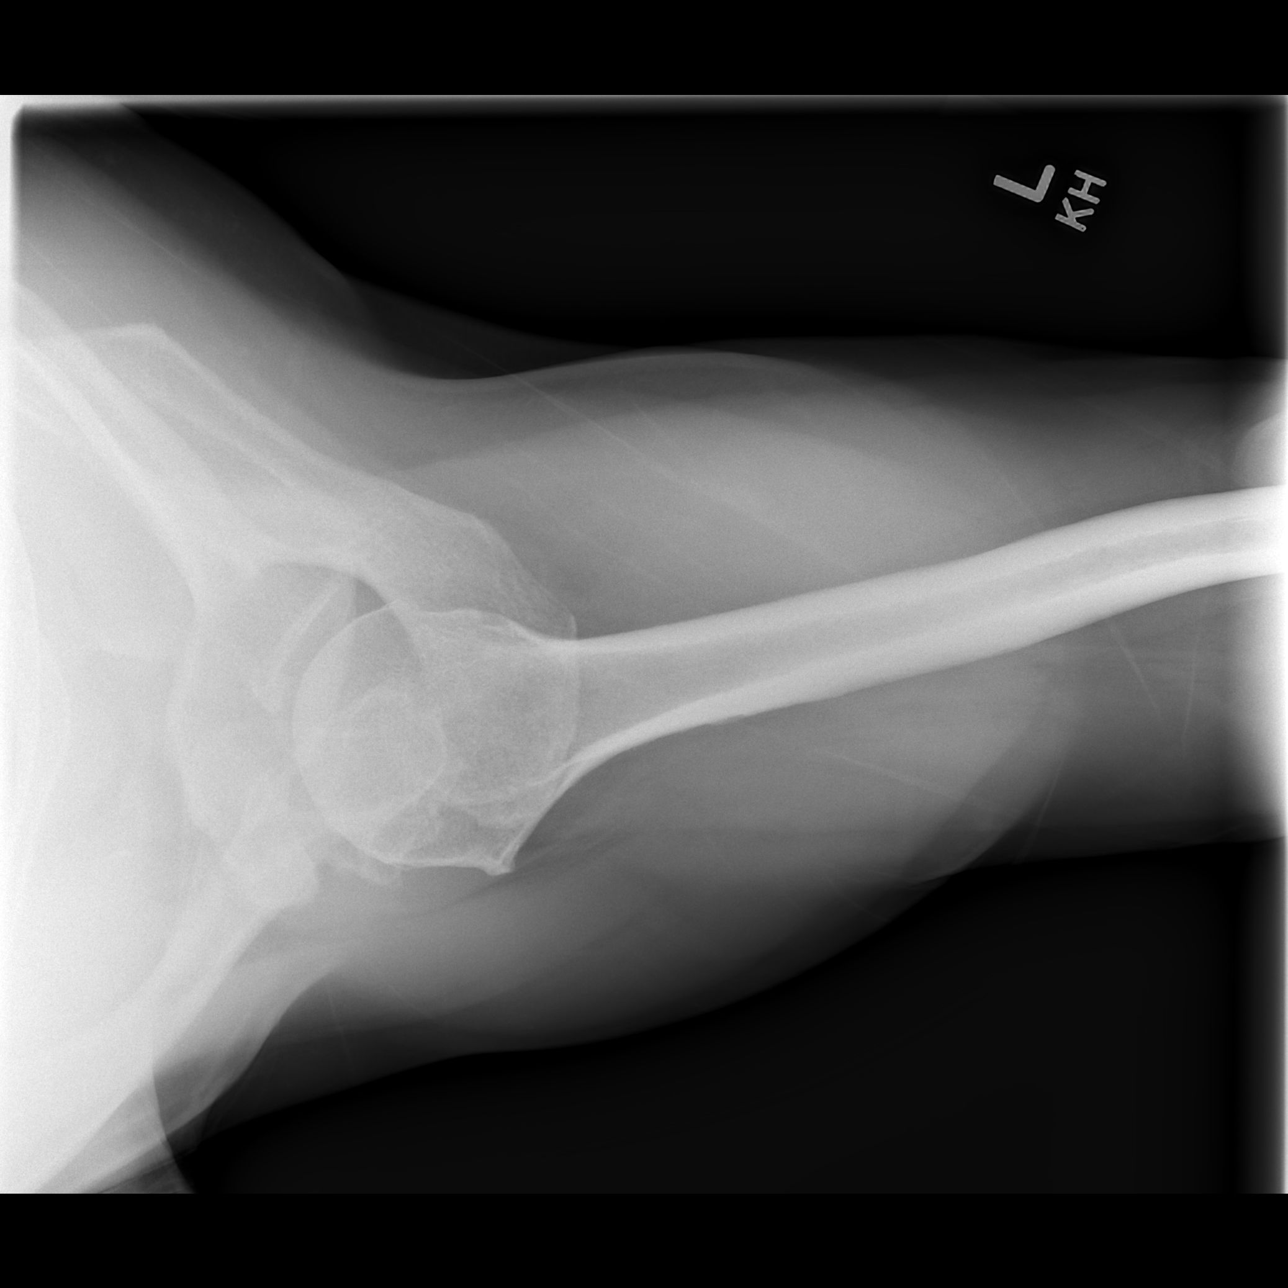

[3 of 3 positions shown; findings below may reference images not displayed]

FINDINGS: There is no evidence of fracture or dislocation. Chronic deformity
of the distal left clavicle is identified. Soft tissues are
unremarkable.
IMPRESSION: No acute fracture or dislocation.

## 2022-04-27 ENCOUNTER — Encounter: Payer: Self-pay | Admitting: *Deleted

## 2022-05-06 DIAGNOSIS — R197 Diarrhea, unspecified: Secondary | ICD-10-CM | POA: Insufficient documentation

## 2022-09-15 ENCOUNTER — Ambulatory Visit (INDEPENDENT_AMBULATORY_CARE_PROVIDER_SITE_OTHER): Payer: Medicare HMO | Admitting: Pulmonary Disease

## 2022-09-15 ENCOUNTER — Encounter: Payer: Self-pay | Admitting: Pulmonary Disease

## 2022-09-15 VITALS — BP 127/74 | HR 87 | Ht 69.0 in | Wt 200.0 lb

## 2022-09-15 DIAGNOSIS — R0602 Shortness of breath: Secondary | ICD-10-CM

## 2022-09-15 DIAGNOSIS — Z720 Tobacco use: Secondary | ICD-10-CM | POA: Diagnosis not present

## 2022-09-15 MED ORDER — ALBUTEROL SULFATE HFA 108 (90 BASE) MCG/ACT IN AERS: 2.0000 | INHALATION_SPRAY | Freq: Four times a day (QID) | RESPIRATORY_TRACT | 1 refills | Status: AC | PRN

## 2022-09-15 NOTE — Progress Notes (Signed)
Subjective:    Patient ID: Robert Neal, male    DOB: Oct 21, 1955, 67 y.o.   MRN: [id number]  HPI  67 year old heavy ex-smoker from.  [place] presents for evaluation of shortness of breath. He reports dyspnea on exertion for 1 year, nonprogressive.  He feels that he wears out easily he feels that he cannot get a deep breath and is only getting three fourths of his breath.  He denies cough or wheezing. Chest x-ray 01/2020 was clear PFTs in 2017 did not show significant airway obstruction  He admits to significant stress since his older brother is dying of stomach cancer. He smoked about a pack per day for 30 years before he quit in 2014.  He is retired and used to work at a [employer].  He drinks 2 beers every night He denies chest pain or leg claudication  PMH -hypertension GERD  Significant tests/ events reviewed  PFTs 04/2015 with obstruction, ratio 77, 100%, FVC 99%, 14% bronchodilator response, TLC 125% hyperinflation, DLCO normal  HST 04/2015 OSA 8/hour, low sat 77%, 17 central apneas  Past Medical History:  Diagnosis Date   Anxiety    BPH (benign prostatic hypertrophy)    Esophageal reflux    Hyperlipidemia    Hypertension    Insomnia    Past Surgical History:  Procedure Laterality Date   HERNIA REPAIR      Allergies  Allergen Reactions   Acetaminophen     Social History   Socioeconomic History   Marital status: Married    Spouse name: Not on file   Number of children: Not on file   Years of education: Not on file   Highest education level: Not on file  Occupational History   Not on file  Tobacco Use   Smoking status: Former    Current packs/day: 0.00    Average packs/day: 1 pack/day for 8.0 years (8.0 ttl pk-yrs)    Types: Cigarettes    Start date: 01/12/2005    Quit date: 01/12/2013    Years since quitting: 9.6   Smokeless tobacco: Never   Tobacco comments:    socially smoked x70yrs  Substance and Sexual Activity   Alcohol use: Yes     Alcohol/week: 0.0 standard drinks of alcohol    Comment: 10 beers daily   Drug use: No   Sexual activity: Not on file  Other Topics Concern   Not on file  Social History Narrative   Married, lives with spouse   No children   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[employer]   No recent travel   Social Determinants of Health   Financial Resource Strain: Not on file  Food Insecurity: No Food Insecurity (07/25/2021)   Received from [facility]   Hunger Vital Sign    Worried About Running Out of Food in the Last Year: Never true    Ran Out of Food in the Last Year: Never true  Transportation Needs: Not on file  Physical Activity: Not on file  Stress: Not on file  Social Connections: Unknown (05/12/2021)   Received from [facility]   Social Network    Social Network: Not on file  Intimate Partner Violence: Unknown (04/15/2021)   Received from [facility]   HITS    Physically Hurt: Not on file    Insult or Talk Down To: Not on file    Threaten Physical Harm: Not on file    Scream or Curse: Not on file    Family History  Problem Relation Age  of Onset   Lung cancer Father        worked with chemicals, chain smoker     Review of Systems Constitutional: negative for anorexia, fevers and sweats  Eyes: negative for irritation, redness and visual disturbance  Ears, nose, mouth, throat, and face: negative for earaches, epistaxis, nasal congestion and sore throat  Respiratory: negative for cough,  sputum and wheezing  Cardiovascular: negative for chest pain,  lower extremity edema, orthopnea, palpitations and syncope  Gastrointestinal: negative for abdominal pain, constipation, diarrhea, melena, nausea and vomiting  Genitourinary:negative for dysuria, frequency and hematuria  Hematologic/lymphatic: negative for bleeding, easy bruising and lymphadenopathy  Musculoskeletal:negative for arthralgias, muscle weakness and stiff joints  Neurological: negative for coordination problems, gait problems, headaches and  weakness  Endocrine: negative for diabetic symptoms including polydipsia, polyuria and weight loss     Objective:   Physical Exam  Gen. Pleasant, well-nourished, in no distress, normal affect ENT - no pallor,icterus, no post nasal drip Neck: No JVD, no thyromegaly, no carotid bruits Lungs: no use of accessory muscles, no dullness to percussion, clear without rales or rhonchi  Cardiovascular: Rhythm regular, heart sounds  normal, no murmurs or gallops, no peripheral edema Abdomen: soft and non-tender, no hepatosplenomegaly, BS normal. Musculoskeletal: No deformities, no cyanosis or clubbing Neuro:  alert, non focal       Assessment & Plan:

## 2022-09-15 NOTE — Assessment & Plan Note (Signed)
Unclear cause of shortness of breath. Previous PFTs have not shown significant airway obstruction He quit smoking 10 years ago.  Chest x-ray from 2022 did not show any evidence of ILD. Due to his heavy tobacco smoking in the past, he will qualify for lung cancer screening program we will proceed with low-dose CT scan. Will also schedule repeat PFTs.  Meantime we will provide him with albuterol inhaler to use on an as-needed basis He does seem to have considerable anxiety and would like his lungs to be checked out

## 2022-09-15 NOTE — Addendum Note (Signed)
Addended by: Shelby Dubin on: 09/15/2022 02:11 PM   Modules accepted: Orders

## 2022-09-15 NOTE — Patient Instructions (Signed)
x referral to lung cancer screening program  x schedule PFTs  X Rx for albuterol MDI 2 puffs every 6 hours as needed only

## 2022-09-24 ENCOUNTER — Telehealth: Payer: Self-pay | Admitting: *Deleted

## 2022-09-24 NOTE — Telephone Encounter (Signed)
Family Medicine calling asking we call PT to sched LCS. Thanks.-TO

## 2022-09-25 NOTE — Telephone Encounter (Signed)
Dr. Vassie Loll has scheduled the patient for LDCT 10/01/22

## 2022-10-01 ENCOUNTER — Ambulatory Visit
Admission: RE | Admit: 2022-10-01 | Discharge: 2022-10-01 | Disposition: A | Discharge status: Home / Self Care | Payer: Medicare HMO | Source: Ambulatory Visit | Attending: Pulmonary Disease | Admitting: Pulmonary Disease

## 2022-10-01 DIAGNOSIS — Z720 Tobacco use: Secondary | ICD-10-CM

## 2022-10-08 ENCOUNTER — Telehealth: Payer: Self-pay | Admitting: Pulmonary Disease

## 2022-10-08 NOTE — Telephone Encounter (Signed)
Robert Neal would like office notes. Robert Neal phone number is [phone]. Fax number is [phone].

## 2022-10-08 NOTE — Telephone Encounter (Signed)
Please forward to HIM. Thank you!

## 2022-10-08 NOTE — Telephone Encounter (Signed)
Sent. NFN

## 2022-11-24 ENCOUNTER — Emergency Department (HOSPITAL_COMMUNITY): Payer: Medicare HMO

## 2022-11-24 ENCOUNTER — Encounter (HOSPITAL_COMMUNITY): Payer: Self-pay

## 2022-11-24 ENCOUNTER — Emergency Department (HOSPITAL_COMMUNITY)
Admission: EM | Admit: 2022-11-24 | Discharge: 2022-11-24 | Disposition: A | Discharge status: Home / Self Care | Payer: Medicare HMO | Attending: Emergency Medicine | Admitting: Emergency Medicine

## 2022-11-24 ENCOUNTER — Other Ambulatory Visit: Payer: Self-pay

## 2022-11-24 DIAGNOSIS — E876 Hypokalemia: Secondary | ICD-10-CM | POA: Insufficient documentation

## 2022-11-24 DIAGNOSIS — R110 Nausea: Secondary | ICD-10-CM | POA: Insufficient documentation

## 2022-11-24 DIAGNOSIS — R109 Unspecified abdominal pain: Secondary | ICD-10-CM | POA: Insufficient documentation

## 2022-11-24 DIAGNOSIS — Z79899 Other long term (current) drug therapy: Secondary | ICD-10-CM | POA: Insufficient documentation

## 2022-11-24 LAB — CBC
HCT: 51.7 % (ref 39.0–52.0)
Hemoglobin: 18.2 g/dL — ABNORMAL HIGH (ref 13.0–17.0)
MCH: 31.6 pg (ref 26.0–34.0)
MCHC: 35.2 g/dL (ref 30.0–36.0)
MCV: 89.8 fL (ref 80.0–100.0)
Platelets: 305 10*3/uL (ref 150–400)
RBC: 5.76 MIL/uL (ref 4.22–5.81)
RDW: 12.2 % (ref 11.5–15.5)
WBC: 9.3 10*3/uL (ref 4.0–10.5)
nRBC: 0 % (ref 0.0–0.2)

## 2022-11-24 LAB — COMPREHENSIVE METABOLIC PANEL
ALT: 35 U/L (ref 0–44)
AST: 28 U/L (ref 15–41)
Albumin: 4.2 g/dL (ref 3.5–5.0)
Alkaline Phosphatase: 77 U/L (ref 38–126)
Anion gap: 14 (ref 5–15)
BUN: 9 mg/dL (ref 8–23)
CO2: 28 mmol/L (ref 22–32)
Calcium: 9.5 mg/dL (ref 8.9–10.3)
Chloride: 94 mmol/L — ABNORMAL LOW (ref 98–111)
Creatinine, Ser: 0.69 mg/dL (ref 0.61–1.24)
GFR, Estimated: >60 mL/min (ref >60)
Glucose, Bld: 96 mg/dL (ref 70–99)
Potassium: 2.7 mmol/L — CL (ref 3.5–5.1)
Sodium: 136 mmol/L (ref 135–145)
Total Bilirubin: 1.2 mg/dL — ABNORMAL HIGH (ref <1.2)
Total Protein: 8.3 g/dL — ABNORMAL HIGH (ref 6.5–8.1)

## 2022-11-24 LAB — URINALYSIS, ROUTINE W REFLEX MICROSCOPIC
Bacteria, UA: NONE SEEN
Bilirubin Urine: NEGATIVE
Glucose, UA: NEGATIVE mg/dL
Ketones, ur: NEGATIVE mg/dL
Nitrite: NEGATIVE
Protein, ur: NEGATIVE mg/dL
Specific Gravity, Urine: 1.008 (ref 1.005–1.030)
pH: 6 (ref 5.0–8.0)

## 2022-11-24 LAB — TROPONIN I (HIGH SENSITIVITY)
Troponin I (High Sensitivity): 4 ng/L (ref <18)
Troponin I (High Sensitivity): 4 ng/L (ref <18)

## 2022-11-24 LAB — LIPASE, BLOOD: Lipase: 35 U/L (ref 11–51)

## 2022-11-24 MED ORDER — PANTOPRAZOLE SODIUM 40 MG IV SOLR
40.0000 mg | Freq: Once | INTRAVENOUS | Status: AC
  Administered 2022-11-24: 40 mg via INTRAVENOUS
  Filled 2022-11-24: qty 10

## 2022-11-24 MED ORDER — SODIUM CHLORIDE 0.9 % IV BOLUS
1000.0000 mL | Freq: Once | INTRAVENOUS | Status: AC
  Administered 2022-11-24: 1000 mL via INTRAVENOUS

## 2022-11-24 MED ORDER — ALUM & MAG HYDROXIDE-SIMETH 200-200-20 MG/5ML PO SUSP
15.0000 mL | Freq: Once | ORAL | Status: AC
  Administered 2022-11-24: 15 mL via ORAL
  Filled 2022-11-24: qty 30

## 2022-11-24 MED ORDER — LIDOCAINE VISCOUS HCL 2 % MT SOLN
15.0000 mL | Freq: Once | OROMUCOSAL | Status: AC
  Administered 2022-11-24: 15 mL via OROMUCOSAL
  Filled 2022-11-24: qty 15

## 2022-11-24 MED ORDER — POTASSIUM CHLORIDE CRYS ER 20 MEQ PO TBCR
40.0000 meq | EXTENDED_RELEASE_TABLET | Freq: Once | ORAL | Status: AC
  Administered 2022-11-24: 40 meq via ORAL
  Filled 2022-11-24: qty 4

## 2022-11-24 NOTE — ED Provider Notes (Signed)
[place] EMERGENCY DEPARTMENT AT [facility] Provider Note   CSN: [id number] Arrival date & time: 11/24/22  1335     History  Chief Complaint  Patient presents with   Abdominal Pain    Robert Neal is a 67 y.o. male.  67 year old male with prior medical history detailed below presents evaluation.  Patient with longstanding history of GI discomfort and issues.  Patient is followed by [facility] service.  Patient reports increased abdominal cramping with some mild nausea this morning.  He denies chest pain or shortness of breath.  He reports that his symptoms today are consistent with prior exacerbations of his underlying chronic conditions.  The history is provided by the patient and medical records.       Home Medications Prior to Admission medications   Medication Sig Start Date End Date Taking? Authorizing Provider  albuterol (VENTOLIN HFA) 108 (90 Base) MCG/ACT inhaler Inhale 2 puffs into the lungs every 6 (six) hours as needed for wheezing or shortness of breath. 09/15/22   Oretha Milch, MD  amLODipine (NORVASC) 10 MG tablet Take 1 tablet (10 mg total) by mouth daily. 10/16/11   Laurey Morale, MD  chlorthalidone (HYGROTON) 25 MG tablet Take 25 mg by mouth daily.    [provider]  Diclofenac Sodium (PENNSAID) 2 % SOLN Place 1 application onto the skin 2 (two) times daily. 10/17/19   Myra Rude, MD  hydrochlorothiazide (MICROZIDE) 12.5 MG capsule Take 12.5 mg by mouth daily. Patient not taking: Reported on 09/15/2022    [provider]  omeprazole (PRILOSEC) 20 MG capsule Take 20 mg by mouth daily.    [provider]  zolpidem (AMBIEN) 10 MG tablet Take 10 mg by mouth at bedtime as needed for sleep.    [provider]      Allergies    Acetaminophen    Review of Systems   Review of Systems  All other systems reviewed and are negative.   Physical Exam Updated Vital Signs BP (!) 139/97   Pulse (!) 101    Temp 98.7 F (37.1 C)   Resp (!) 23   Wt 90 kg   SpO2 100%   BMI 29.30 kg/m  Physical Exam Vitals and nursing note reviewed.  Constitutional:      General: He is not in acute distress.    Appearance: Normal appearance. He is well-developed.  HENT:     Head: Normocephalic and atraumatic.  Eyes:     Conjunctiva/sclera: Conjunctivae normal.     Pupils: Pupils are equal, round, and reactive to light.  Cardiovascular:     Rate and Rhythm: Normal rate and regular rhythm.     Heart sounds: Normal heart sounds.  Pulmonary:     Effort: Pulmonary effort is normal. No respiratory distress.     Breath sounds: Normal breath sounds.  Abdominal:     General: There is no distension.     Palpations: Abdomen is soft.     Tenderness: There is no abdominal tenderness.  Musculoskeletal:        General: No deformity. Normal range of motion.     Cervical back: Normal range of motion and neck supple.  Skin:    General: Skin is warm and dry.  Neurological:     General: No focal deficit present.     Mental Status: He is alert and oriented to person, place, and time.     ED Results / Procedures / Treatments  Labs (all labs ordered are listed, but only abnormal results are displayed) Labs Reviewed  COMPREHENSIVE METABOLIC PANEL - Abnormal; Notable for the following components:      Result Value   Potassium 2.7 (*)    Chloride 94 (*)    Total Protein 8.3 (*)    Total Bilirubin 1.2 (*)    All other components within normal limits  CBC - Abnormal; Notable for the following components:   Hemoglobin 18.2 (*)    All other components within normal limits  URINALYSIS, ROUTINE W REFLEX MICROSCOPIC - Abnormal; Notable for the following components:   Hgb urine dipstick SMALL (*)    Leukocytes,Ua TRACE (*)    All other components within normal limits  LIPASE, BLOOD  TROPONIN I (HIGH SENSITIVITY)  TROPONIN I (HIGH SENSITIVITY)    EKG None  Radiology No results  found.  Procedures Procedures    Medications Ordered in ED Medications  potassium chloride SA (KLOR-CON M) CR tablet 40 mEq (has no administration in time range)  sodium chloride 0.9 % bolus 1,000 mL (has no administration in time range)  pantoprazole (PROTONIX) injection 40 mg (has no administration in time range)  alum & mag hydroxide-simeth (MAALOX/MYLANTA) 200-200-20 MG/5ML suspension 15 mL (has no administration in time range)  lidocaine (XYLOCAINE) 2 % viscous mouth solution 15 mL (has no administration in time range)    ED Course/ Medical Decision Making/ A&P                                 Medical Decision Making Amount and/or Complexity of Data Reviewed Labs: ordered.  Risk OTC drugs. Prescription drug management.    Medical Screen Complete  This patient presented to the ED with complaint of abdominal pain/nausea.  This complaint involves an extensive number of treatment options. The initial differential diagnosis includes, but is not limited to, acute on chronic abdominal pain secondary to visceral hypersensitivity, metabolic abnormality, etc.  This presentation is: Acute, Chronic, Self-Limited, Previously Undiagnosed, Uncertain Prognosis, Complicated, Systemic Symptoms, and Threat to Life/Bodily Function  Patient with known chronic abdominal conditions including visceral hypersensitivity.  Patient is followed by [facility].  Patient reports worsening of his chronic issues over the last 24 hours.  Screening labs are without significant abnormality other than decreased potassium at 2.7.  After treatment and evaluation in the ED the patient feels much improved.  He desires discharge.  He understands need for close outpatient follow-up with his already established GI provider.  Strict return precautions given understood.    Additional history obtained: External records from outside sources obtained and reviewed including prior ED visits and prior  Inpatient records.    Lab Tests:  I ordered and personally interpreted labs.  The pertinent results include: CBC, CMP, lipase, troponin, UA   Imaging Studies ordered:  I ordered imaging studies including chest x-ray I independently visualized and interpreted obtained imaging which showed NAD I agree with the radiologist interpretation.   Cardiac Monitoring:  The patient was maintained on a cardiac monitor.  I personally viewed and interpreted the cardiac monitor which showed an underlying rhythm of: NSR   Medicines ordered:  I ordered medication including potassium for hypokalemia Reevaluation of the patient after these medicines showed that the patient: improved  Problem List / ED Course:  Abdominal pain, hypokalemia   Reevaluation:  After the interventions noted above, I reevaluated the patient and found that they have: resolved  Disposition:  After consideration of the diagnostic results and the patients response to treatment, I feel that the patent would benefit from close outpatient follow-up.          Final Clinical Impression(s) / ED Diagnoses Final diagnoses:  Abdominal pain, unspecified abdominal location  Hypokalemia    Rx / DC Orders ED Discharge Orders     None         Wynetta Fines, MD 11/24/22 2228

## 2022-11-24 NOTE — Discharge Instructions (Addendum)
Return for any problem.  ?

## 2022-11-24 NOTE — ED Triage Notes (Signed)
C/o epigastric abd pain with nausea and diarrhea x1 year.  EGD schedule for 11/26.

## 2022-11-24 NOTE — ED Provider Triage Note (Signed)
Emergency Medicine Provider Triage Evaluation Note  Robert Neal , a 67 y.o. male  was evaluated in triage.  Pt complains of abd pain.  Review of Systems  Positive: Abd pain, chest pain  Negative: Vomiting fever  Physical Exam  BP (!) 141/96   Pulse 100   Temp 98 F (36.7 C)   Resp 20   Wt 90 kg   SpO2 100%   BMI 29.30 kg/m  Gen:   Awake, no distress   Resp:  Normal effort  MSK:   Moves extremities without difficulty  Other:    Medical Decision Making  Medically screening exam initiated at 2:13 PM.  Appropriate orders placed.  Robert Neal was informed that the remainder of the evaluation will be completed by another provider, this initial triage assessment does not replace that evaluation, and the importance of remaining in the ED until their evaluation is complete.  Abdominal pain x 1 year Now having chest pain affected by breathing No fever   Elpidio Anis, PA-C 11/24/22 1415

## 2022-12-21 ENCOUNTER — Ambulatory Visit (HOSPITAL_BASED_OUTPATIENT_CLINIC_OR_DEPARTMENT_OTHER): Payer: Managed Care, Other (non HMO) | Admitting: Pulmonary Disease

## 2022-12-21 ENCOUNTER — Encounter (HOSPITAL_BASED_OUTPATIENT_CLINIC_OR_DEPARTMENT_OTHER): Payer: Managed Care, Other (non HMO)

## 2023-01-25 ENCOUNTER — Ambulatory Visit (HOSPITAL_BASED_OUTPATIENT_CLINIC_OR_DEPARTMENT_OTHER): Payer: Medicare HMO | Admitting: Pulmonary Disease

## 2023-01-25 ENCOUNTER — Encounter (HOSPITAL_BASED_OUTPATIENT_CLINIC_OR_DEPARTMENT_OTHER): Payer: Self-pay | Admitting: Pulmonary Disease

## 2023-01-25 VITALS — BP 148/90 | HR 92 | Ht 69.0 in | Wt 207.0 lb

## 2023-01-25 DIAGNOSIS — R0609 Other forms of dyspnea: Secondary | ICD-10-CM | POA: Diagnosis not present

## 2023-01-25 DIAGNOSIS — R0602 Shortness of breath: Secondary | ICD-10-CM

## 2023-01-25 DIAGNOSIS — I7121 Aneurysm of the ascending aorta, without rupture: Secondary | ICD-10-CM | POA: Diagnosis not present

## 2023-01-25 LAB — PULMONARY FUNCTION TEST
DL/VA % pred: 107 %
DL/VA: 4.44 ml/min/mmHg/L
DLCO cor % pred: 102 %
DLCO cor: 26.53 ml/min/mmHg
DLCO unc % pred: 102 %
DLCO unc: 26.53 ml/min/mmHg
FEF 25-75 Post: 3.77 L/s
FEF 25-75 Pre: 2.85 L/s
FEF2575-%Change-Post: 32 %
FEF2575-%Pred-Post: 146 %
FEF2575-%Pred-Pre: 110 %
FEV1-%Change-Post: 7 %
FEV1-%Pred-Post: 109 %
FEV1-%Pred-Pre: 102 %
FEV1-Post: 3.58 L
FEV1-Pre: 3.35 L
FEV1FVC-%Change-Post: 4 %
FEV1FVC-%Pred-Pre: 106 %
FEV6-%Change-Post: 2 %
FEV6-%Pred-Post: 103 %
FEV6-%Pred-Pre: 100 %
FEV6-Post: 4.3 L
FEV6-Pre: 4.19 L
FEV6FVC-%Change-Post: 0 %
FEV6FVC-%Pred-Post: 105 %
FEV6FVC-%Pred-Pre: 104 %
FVC-%Change-Post: 2 %
FVC-%Pred-Post: 98 %
FVC-%Pred-Pre: 95 %
FVC-Post: 4.32 L
FVC-Pre: 4.23 L
Post FEV1/FVC ratio: 83 %
Post FEV6/FVC ratio: 100 %
Pre FEV1/FVC ratio: 79 %
Pre FEV6/FVC Ratio: 99 %
RV % pred: 73 %
RV: 1.7 L
TLC % pred: 88 %
TLC: 6.02 L

## 2023-01-25 NOTE — Progress Notes (Signed)
   Subjective:    Patient ID: Robert Neal, male    DOB: 1955-03-17, 68 y.o.   MRN: [id number]  HPI  68 year old heavy ex-smoker from [place]  for FU of shortness of breath. He reports dyspnea on exertion for 1 year, nonprogressive.  He feels that he wears out easily he feels that he cannot get a deep breath and is only getting three fourths of his breath.   Chest x-ray 01/2020 was clear PFTs in 2017 did not show significant airway obstruction   He smoked about a pack per day for 30 years before he quit in 2014.  He is retired and used to work at a [employer].  He drinks 2 beers every night    PMH -hypertension GERD  He continues to complain of shortness of breath.  His older brother passed away due to stomach cancer.  He admits to significant stress taking care of his brother for the last 3 years, he was finally on hospice.  He is still taking care of paperwork and dealing with family.  He needs a break We reviewed PFTs, low-dose CT chest today   Significant tests/ events reviewed  LDCT chest 10/2022 >> RADS 2 , Ascending aortic dilatation at 4.2 cm  PFTs 01/2023 normal    PFTs 04/2015 with obstruction, ratio 77, 100%, FVC 99%, 14% bronchodilator response, TLC 125% hyperinflation, DLCO normal   HST 04/2015 OSA 8/hour, low sat 77%, 17 central apneas    Review of Systems neg for any significant sore throat, dysphagia, itching, sneezing, nasal congestion or excess/ purulent secretions, fever, chills, sweats, unintended wt loss, pleuritic or exertional cp, hempoptysis, orthopnea pnd or change in chronic leg swelling. Also denies presyncope, palpitations, heartburn, abdominal pain, nausea, vomiting, diarrhea or change in bowel or urinary habits, dysuria,hematuria, rash, arthralgias, visual complaints, headache, numbness weakness or ataxia.     Objective:   Physical Exam  Gen. Pleasant, obese, in no distress ENT - no lesions, no post nasal drip Neck: No JVD, no thyromegaly, no  carotid bruits Lungs: no use of accessory muscles, no dullness to percussion, decreased without rales or rhonchi  Cardiovascular: Rhythm regular, heart sounds  normal, no murmurs or gallops, no peripheral edema Musculoskeletal: No deformities, no cyanosis or clubbing , no tremors       Assessment & Plan:    Dyspnea on exertion -PFTs normal. Low-dose CT chest did not show any pathology other than ascending aortic dilation. Will check echo for aortic valve disorder as cause of dyspnea, he does not have a murmur or other signs of aortic regurg.  No evidence of angina

## 2023-01-25 NOTE — Progress Notes (Signed)
 Full PFT Performed Today.  Reviewed PFTs which are essentially normal

## 2023-01-25 NOTE — Patient Instructions (Signed)
 Lung function and CT scan appears okay.  X Check echocardiogram

## 2023-01-25 NOTE — Patient Instructions (Signed)
 Full PFT Performed Today

## 2023-02-18 ENCOUNTER — Ambulatory Visit (HOSPITAL_COMMUNITY): Payer: Medicare HMO | Attending: Pulmonary Disease

## 2023-02-18 DIAGNOSIS — I7121 Aneurysm of the ascending aorta, without rupture: Secondary | ICD-10-CM | POA: Insufficient documentation

## 2023-02-18 DIAGNOSIS — R0609 Other forms of dyspnea: Secondary | ICD-10-CM | POA: Diagnosis present

## 2023-02-18 LAB — ECHOCARDIOGRAM COMPLETE
Area-P 1/2: 2.42 cm2
S' Lateral: 2.65 cm

## 2023-02-23 ENCOUNTER — Encounter (HOSPITAL_BASED_OUTPATIENT_CLINIC_OR_DEPARTMENT_OTHER): Payer: Self-pay | Admitting: Pulmonary Disease

## 2023-08-03 ENCOUNTER — Other Ambulatory Visit: Payer: Self-pay

## 2023-08-03 ENCOUNTER — Emergency Department (HOSPITAL_BASED_OUTPATIENT_CLINIC_OR_DEPARTMENT_OTHER)
Admission: EM | Admit: 2023-08-03 | Discharge: 2023-08-03 | Disposition: A | Discharge status: Home / Self Care | Attending: Emergency Medicine | Admitting: Emergency Medicine

## 2023-08-03 ENCOUNTER — Emergency Department (HOSPITAL_BASED_OUTPATIENT_CLINIC_OR_DEPARTMENT_OTHER)

## 2023-08-03 ENCOUNTER — Encounter (HOSPITAL_BASED_OUTPATIENT_CLINIC_OR_DEPARTMENT_OTHER): Payer: Self-pay | Admitting: *Deleted

## 2023-08-03 DIAGNOSIS — R1032 Left lower quadrant pain: Secondary | ICD-10-CM | POA: Diagnosis present

## 2023-08-03 DIAGNOSIS — N5082 Scrotal pain: Secondary | ICD-10-CM | POA: Diagnosis not present

## 2023-08-03 DIAGNOSIS — M79605 Pain in left leg: Secondary | ICD-10-CM | POA: Diagnosis not present

## 2023-08-03 LAB — COMPREHENSIVE METABOLIC PANEL WITH GFR
ALT: 24 U/L (ref 0–44)
AST: 18 U/L (ref 15–41)
Albumin: 4 g/dL (ref 3.5–5.0)
Alkaline Phosphatase: 67 U/L (ref 38–126)
Anion gap: 12 (ref 5–15)
BUN: 19 mg/dL (ref 8–23)
CO2: 25 mmol/L (ref 22–32)
Calcium: 9.7 mg/dL (ref 8.9–10.3)
Chloride: 105 mmol/L (ref 98–111)
Creatinine, Ser: 1.12 mg/dL (ref 0.61–1.24)
GFR, Estimated: >60 mL/min (ref >60)
Glucose, Bld: 126 mg/dL — ABNORMAL HIGH (ref 70–99)
Potassium: 4 mmol/L (ref 3.5–5.1)
Sodium: 142 mmol/L (ref 135–145)
Total Bilirubin: 0.5 mg/dL (ref 0.0–1.2)
Total Protein: 7.1 g/dL (ref 6.5–8.1)

## 2023-08-03 LAB — CBC WITH DIFFERENTIAL/PLATELET
Abs Immature Granulocytes: 0.03 K/uL (ref 0.00–0.07)
Basophils Absolute: 0 K/uL (ref 0.0–0.1)
Basophils Relative: 1 %
Eosinophils Absolute: 0.1 K/uL (ref 0.0–0.5)
Eosinophils Relative: 1 %
HCT: 47 % (ref 39.0–52.0)
Hemoglobin: 15.8 g/dL (ref 13.0–17.0)
Immature Granulocytes: 0 %
Lymphocytes Relative: 19 %
Lymphs Abs: 1.4 K/uL (ref 0.7–4.0)
MCH: 31.3 pg (ref 26.0–34.0)
MCHC: 33.6 g/dL (ref 30.0–36.0)
MCV: 93.1 fL (ref 80.0–100.0)
Monocytes Absolute: 0.7 K/uL (ref 0.1–1.0)
Monocytes Relative: 9 %
Neutro Abs: 5.1 K/uL (ref 1.7–7.7)
Neutrophils Relative %: 70 %
Platelets: 258 K/uL (ref 150–400)
RBC: 5.05 MIL/uL (ref 4.22–5.81)
RDW: 13.6 % (ref 11.5–15.5)
WBC: 7.4 K/uL (ref 4.0–10.5)
nRBC: 0 % (ref 0.0–0.2)

## 2023-08-03 LAB — URINALYSIS, ROUTINE W REFLEX MICROSCOPIC
Bacteria, UA: NONE SEEN
Bilirubin Urine: NEGATIVE
Glucose, UA: NEGATIVE mg/dL
Hgb urine dipstick: NEGATIVE
Ketones, ur: NEGATIVE mg/dL
Leukocytes,Ua: NEGATIVE
Nitrite: NEGATIVE
Protein, ur: NEGATIVE mg/dL
Specific Gravity, Urine: 1.029 (ref 1.005–1.030)
pH: 5 (ref 5.0–8.0)

## 2023-08-03 MED ORDER — OXYCODONE HCL 5 MG PO TABS: 5.0000 mg | ORAL_TABLET | Freq: Four times a day (QID) | ORAL | 0 refills | Status: AC | PRN

## 2023-08-03 NOTE — ED Triage Notes (Signed)
 Pt to ED reporting left sided groin and testicular pain 3 weeks ago. Pt then went hiking and reports pain worsened. No urinary changes.   Patient went to orthopedic MD who looked at his back but patient reporting that is not the location of most of his pain. Pt taking tizanidine and prednisone without relief.

## 2023-08-03 NOTE — ED Provider Notes (Signed)
 [place] EMERGENCY DEPARTMENT AT [facility] Provider Note   CSN: [id number] Arrival date & time: 08/03/23  1027     Patient presents with: Groin Pain and Leg Injury   Robert Neal is a 68 y.o. male.   Patient presents to the emergency department for evaluation of left thigh and testicular pain starting about 3 weeks ago.  Patient has a history of a hernia repair.  He started to have pain that has gradually worsened.  Pain is worse with walking, causing difficulty sleeping.  Patient states that he presented to an orthopedic urgent care about a week ago.  There they were focused on my back.  He was prescribed prednisone and a muscle relaxer that he has taken as directed but has not really been working.  Patient denies warning symptoms of back pain including: fecal incontinence, urinary retention or overflow incontinence, night sweats, waking from sleep with back pain, unexplained fevers or weight loss, h/o cancer, IVDU, recent trauma.  Patient states that he is supposed to have a procedure done, a back injection, tomorrow.  He is concerned that he has a problem in the groin or testicle.  He is hoping to have this evaluated specifically.       Prior to Admission medications   Medication Sig Start Date End Date Taking? Authorizing Provider  albuterol  (VENTOLIN  HFA) 108 (90 Base) MCG/ACT inhaler Inhale 2 puffs into the lungs every 6 (six) hours as needed for wheezing or shortness of breath. 09/15/22   Jude Harden GAILS, MD  amLODipine  (NORVASC ) 10 MG tablet Take 1 tablet (10 mg total) by mouth daily. 10/16/11   Rolan Ezra RAMAN, MD  chlorthalidone  (HYGROTON ) 25 MG tablet Take 25 mg by mouth daily.    [provider]  hydrochlorothiazide (MICROZIDE) 12.5 MG capsule Take 12.5 mg by mouth daily.    [provider]  omeprazole (PRILOSEC) 20 MG capsule Take 20 mg by mouth daily.    [provider]  zolpidem (AMBIEN) 10 MG tablet Take 10 mg by mouth at bedtime as  needed for sleep.    [provider]    Allergies: Acetaminophen     Review of Systems  Updated Vital Signs BP 119/71   Pulse (!) 49   Temp 98.7 F (37.1 C) (Oral)   SpO2 98%   Physical Exam Vitals and nursing note reviewed.  Constitutional:      General: He is not in acute distress.    Appearance: He is well-developed.  HENT:     Head: Normocephalic and atraumatic.  Eyes:     General:        Right eye: No discharge.        Left eye: No discharge.     Conjunctiva/sclera: Conjunctivae normal.  Cardiovascular:     Rate and Rhythm: Normal rate and regular rhythm.     Heart sounds: Normal heart sounds.  Pulmonary:     Effort: Pulmonary effort is normal.     Breath sounds: Normal breath sounds.     Comments: No respiratory distress, normal respiratory rate. Abdominal:     Palpations: Abdomen is soft.     Tenderness: There is no abdominal tenderness.     Hernia: There is no hernia in the left inguinal area or right inguinal area.  Genitourinary:    Pubic Area: No rash.      Testes:        Right: Tenderness or swelling not present.        Left: Tenderness  present. Swelling not present.     Comments: No obvious hernia noted. Musculoskeletal:     Cervical back: Normal range of motion and neck supple.  Skin:    General: Skin is warm and dry.  Neurological:     Mental Status: He is alert.     (all labs ordered are listed, but only abnormal results are displayed) Labs Reviewed  COMPREHENSIVE METABOLIC PANEL WITH GFR - Abnormal; Notable for the following components:      Result Value   Glucose, Bld 126 (*)    All other components within normal limits  CBC WITH DIFFERENTIAL/PLATELET  URINALYSIS, ROUTINE W REFLEX MICROSCOPIC    EKG: None  Radiology: US  SCROTUM W/DOPPLER Result Date: 08/03/2023 CLINICAL DATA:  Three-week history of testicular pain after long hike EXAM: SCROTAL ULTRASOUND DOPPLER ULTRASOUND OF THE TESTICLES TECHNIQUE: Complete ultrasound  examination of the testicles, epididymis, and other scrotal structures was performed. Color and spectral Doppler ultrasound were also utilized to evaluate blood flow to the testicles. COMPARISON:  None Available. FINDINGS: Right testicle Measurements: 5.4 x 3.6 x 2.5 cm, 34.5 mL. No mass or microlithiasis visualized. Left testicle Measurements: 4.9 x 3.4 x 2.2 cm, 26.0 mL. No mass or microlithiasis visualized. Right epididymis:  Normal in size and appearance. Left epididymis:  Normal in size and appearance. Hydrocele:  None visualized. Varicocele:  None visualized. Pulsed Doppler interrogation of both testes demonstrates arterial and venous waveforms bilaterally. The right testis demonstrates elevated peak systolic velocity and slightly elevated resistive index, likely related to sampling of more peripheral vessels. IMPRESSION: 1. No evidence of testicular torsion. 2. Asymmetrically enlarged right testicle demonstrates elevated peak systolic velocity and resistive index, likely artifactual related to sampling technique. No substantial hyperemia or heterogeneity of the right epididymis to suggest early infection. Electronically Signed   By: Limin  Xu M.D.   On: 08/03/2023 12:03     Procedures   Medications Ordered in the ED - No data to display  ED Course  Patient seen and examined. History obtained directly from patient.   Labs/EKG: Ordered CBC, CMP, UA.  Imaging: Ordered scrotal ultrasound with Doppler  Medications/Fluids: None ordered  Most recent vital signs reviewed and are as follows: BP 119/71   Pulse (!) 49   Temp 98.7 F (37.1 C) (Oral)   SpO2 98%   Initial impression: Left groin and thigh pain, currently undergoing treatment for radiculopathy, but not improved at this point.  1:51 PM Reassessment performed. Patient appears stable, comfortable.  Labs personally reviewed and interpreted including: CBC unremarkable; CMP slightly elevated glucose otherwise unremarkable; UA without  signs of infection.  Imaging results reviewed including ultrasound of the scrotum, no structural problems or signs of scrotal hernia or infection.  Reviewed pertinent lab work and imaging with patient at bedside. Questions answered.  We did discuss CT imaging.  Patient does not want any further testing done today.  He states that he has had many scans of his abdomen in the past.  Most current vital signs reviewed and are as follows: BP 119/71   Pulse (!) 49   Temp 98.7 F (37.1 C) (Oral)   SpO2 98%   Plan: Discharge to home.   Prescriptions written for: Oxycodone  # 8 tablets  Patient counseled on use of narcotic pain medications. Counseled not to combine these medications with others containing tylenol . Urged not to drink alcohol, drive, or perform any other activities that requires focus while taking these medications. The patient verbalizes understanding and agrees with the plan.  Other home care instructions discussed: Avoidance of triggers.  ED return instructions discussed: The patient was urged to return to the Emergency Department immediately with worsening of current symptoms, worsening abdominal pain, persistent vomiting, blood noted in stools, fever, or any other concerns.  Encouraged patient to return with worsening leg pain, swelling, chest pain or shortness of breath.  The patient verbalized understanding.    Follow-up instructions discussed: Patient encouraged to follow-up with their PCP in 3 days.  He has outpatient orthopedic follow-up tomorrow.                                  Medical Decision Making Amount and/or Complexity of Data Reviewed Labs: ordered. Radiology: ordered.  Risk Prescription drug management.   Patient presents today for evaluation of left-sided scrotal pain.  He has had recent workup and treatment for lower back issues.  No improvement with steroids or muscle relaxers.  Patient was concerned about his scrotum today.  He was evaluated with  ultrasound.  Lab workup and ultrasound reassuring.  No urinary tract infection.  Considered DVT, however no leg pain, redness or swelling.  Overall low concern clinical concern for this and current presentation would be atypical.  No chest pain or shortness of breath to suggest PE.  No tachycardia or hypoxia as well.  Patient is going to continue evaluation by orthopedics, continuing tomorrow.  Sounds like he is getting an injection in his back.  Discussed that if this does not improve his symptoms may need additional evaluation as an outpatient.  The patient's vital signs, pertinent lab work and imaging were reviewed and interpreted as discussed in the ED course. Hospitalization was considered for further testing, treatments, or serial exams/observation. However as patient is well-appearing, has a stable exam, and reassuring studies today, I do not feel that they warrant admission at this time. This plan was discussed with the patient who verbalizes agreement and comfort with this plan and seems reliable and able to return to the Emergency Department with worsening or changing symptoms.        Final diagnoses:  Left groin pain  Left leg pain    ED Discharge Orders          Ordered    oxyCODONE  (OXY IR/ROXICODONE ) 5 MG immediate release tablet  Every 6 hours PRN        08/03/23 1349               Desiderio Chew, PA-C 08/03/23 1354    Zackowski, Scott, MD 08/04/23 [phone]

## 2023-08-03 NOTE — ED Notes (Signed)
 Patient transported to Ultrasound

## 2023-08-03 NOTE — Discharge Instructions (Signed)
 Please read and follow all provided instructions.  Your diagnoses today include:  1. Left groin pain   2. Left leg pain     Tests performed today include: Complete blood cell count: Was normal Complete metabolic panel: Blood sugar was slightly high otherwise no problems Urinalysis (urine test): Was clear, no signs of infection Ultrasound of the scrotum did not show any structural problems or signs of torsion Vital signs. See below for your results today.   Medications prescribed:  Oxycodone  - narcotic pain medication  DO NOT drive or perform any activities that require you to be awake and alert because this medicine can make you drowsy.   Take any prescribed medications only as directed.  Home care instructions:  Follow any educational materials contained in this packet.  Follow-up instructions: Please follow-up with your primary care provider in the next 3 days for further evaluation of your symptoms.  Please keep orthopedic appointment as planned.  Return instructions:  SEEK IMMEDIATE MEDICAL ATTENTION IF: The pain does not go away or becomes severe  A temperature above 101F develops  Repeated vomiting occurs (multiple episodes)  The pain becomes localized to portions of the abdomen. The right side could possibly be appendicitis. In an adult, the left lower portion of the abdomen could be colitis or diverticulitis.  Blood is being passed in stools or vomit (bright red or black tarry stools)  You develop chest pain, difficulty breathing, dizziness or fainting, or become confused, poorly responsive, or inconsolable (young children) If you have any other emergent concerns regarding your health  Additional Information: Abdominal (belly) pain can be caused by many things. Your caregiver performed an examination and possibly ordered blood/urine tests and imaging (CT scan, x-rays, ultrasound). Many cases can be observed and treated at home after initial evaluation in the emergency  department. Even though you are being discharged home, abdominal pain can be unpredictable. Therefore, you need a repeated exam if your pain does not resolve, returns, or worsens. Most patients with abdominal pain don't have to be admitted to the hospital or have surgery, but serious problems like appendicitis and gallbladder attacks can start out as nonspecific pain. Many abdominal conditions cannot be diagnosed in one visit, so follow-up evaluations are very important.  Your vital signs today were: BP 119/71   Pulse (!) 49   Temp 98.7 F (37.1 C) (Oral)   SpO2 98%  If your blood pressure (bp) was elevated above 135/85 this visit, please have this repeated by your doctor within one month. --------------

## 2023-08-10 NOTE — Procedures (Signed)
 THE PATIENT WAS DESENSITIZED TO THE CPAP DEVICE AT A PRSSURE OF 10CM H20 WITH THE USE OF A LARGE SIZE [employer] NASAL/ORAL MASK. THE PATIENT TOLERATED THE TRIAL WELL WITH NO NOTED COMPLICATIONS. THE MASK WAS GIVEN TO THE PATIENT FOR FOR HOME USE.  -VB

## 2023-08-30 ENCOUNTER — Telehealth: Payer: Self-pay | Admitting: Acute Care

## 2023-08-30 DIAGNOSIS — Z122 Encounter for screening for malignant neoplasm of respiratory organs: Secondary | ICD-10-CM

## 2023-08-30 DIAGNOSIS — Z87891 Personal history of nicotine dependence: Secondary | ICD-10-CM

## 2023-08-30 NOTE — Telephone Encounter (Signed)
 Lung Cancer Screening Narrative/Criteria Questionnaire (Cigarette Smokers Only- No Cigars/Pipes/vapes)   Borna Wessinger   SDMV:09/01/2023 9:15a [place]       10/19/1955   LDCT: 10/04/2023 1:00pm [facility]    68 y.o.   Phone: [phone]  Lung Screening Narrative (confirm age 78-77 yrs Medicare / 50-80 yrs Private pay insurance)   Insurance information:[employer]   Referring Provider:Dr. Jude   This screening involves an initial phone call with a team member from our program. It is called a shared decision making visit. The initial meeting is required by  insurance and Medicare to make sure you understand the program. This appointment takes about 15-20 minutes to complete. You will complete the screening scan at your scheduled date/time.  This scan takes about 5-10 minutes to complete. You can eat and drink normally before and after the scan.  Criteria questions for Lung Cancer Screening:   Are you a current or former smoker? Former Age began smoking: 68yo   If you are a former smoker, what year did you quit smoking? 2017(within 15 yrs)   To calculate your smoking history, I need an accurate estimate of how many packs of cigarettes you smoked per day and for how many years. (Not just the number of PPD you are now smoking)   Years smoking 44 x Packs per day 1 = Pack years 44   (at least 20 pack yrs)   (Make sure they understand that we need to know how much they have smoked in the past, not just the number of PPD they are smoking now)  Do you have a personal history of cancer?  No    Do you have a family history of cancer? Yes  (cancer type and and relative) Father - lung, brother - testicular   Are you coughing up blood?  No  Have you had unexplained weight loss of 15 lbs or more in the last 6 months? No  It looks like you meet all criteria.  When would be a good time for us  to schedule you for this screening?   Additional information: N/A

## 2023-09-01 ENCOUNTER — Ambulatory Visit (INDEPENDENT_AMBULATORY_CARE_PROVIDER_SITE_OTHER): Admitting: Adult Health

## 2023-09-01 ENCOUNTER — Encounter: Payer: Self-pay | Admitting: Adult Health

## 2023-09-01 DIAGNOSIS — Z87891 Personal history of nicotine dependence: Secondary | ICD-10-CM

## 2023-09-01 NOTE — Progress Notes (Signed)
  Virtual Visit via Telephone Note  I connected with Robert Neal , 09/01/23 9:21 AM by a telemedicine application and verified that I am speaking with the correct person using two identifiers.  Location: Patient: home Provider: home   I discussed the limitations of evaluation and management by telemedicine and the availability of in person appointments. The patient expressed understanding and agreed to proceed.   Shared Decision Making Visit Lung Cancer Screening Program [id number])   Eligibility: 68 y.o. Pack Years Smoking History Calculation = 44 pack years  (# packs/per year x # years smoked) Recent History of coughing up blood  no Unexplained weight loss? no ( >Than 15 pounds within the last 6 months ) Prior History Lung / other cancer no (Diagnosis within the last 5 years already requiring surveillance chest CT Scans). Smoking Status Former Smoker Former Smokers: Years since quit: 8 years  Quit Date: 2017  Visit Components: Discussion included one or more decision making aids. YES Discussion included risk/benefits of screening. YES Discussion included potential follow up diagnostic testing for abnormal scans. YES Discussion included meaning and risk of over diagnosis. YES Discussion included meaning and risk of False Positives. YES Discussion included meaning of total radiation exposure. YES  Counseling Included: Importance of adherence to annual lung cancer LDCT screening. YES Impact of comorbidities on ability to participate in the program. YES Ability and willingness to under diagnostic treatment. YES  Smoking Cessation Counseling: Former Smokers:  Discussed the importance of maintaining cigarette abstinence. yes Diagnosis Code: Personal History of Nicotine Dependence. S12.108 Information about tobacco cessation classes and interventions provided to patient. Yes Patient provided with ticket for LDCT Scan. yes Written Order for Lung Cancer Screening with LDCT placed  in Epic. Yes (CT Chest Lung Cancer Screening Low Dose W/O CM) [id number]  Z12.2-Screening of respiratory organs Z87.891-Personal history of nicotine dependence   [name] 09/01/23

## 2023-09-01 NOTE — Patient Instructions (Signed)

## 2023-09-28 ENCOUNTER — Encounter: Payer: Self-pay | Admitting: Acute Care

## 2023-10-04 ENCOUNTER — Ambulatory Visit
Admission: RE | Admit: 2023-10-04 | Discharge: 2023-10-04 | Disposition: A | Discharge status: Home / Self Care | Source: Ambulatory Visit | Attending: Acute Care | Admitting: Acute Care

## 2023-10-04 DIAGNOSIS — Z122 Encounter for screening for malignant neoplasm of respiratory organs: Secondary | ICD-10-CM

## 2023-10-04 DIAGNOSIS — Z87891 Personal history of nicotine dependence: Secondary | ICD-10-CM

## 2023-10-12 ENCOUNTER — Other Ambulatory Visit: Payer: Self-pay | Admitting: Acute Care

## 2023-10-12 DIAGNOSIS — Z122 Encounter for screening for malignant neoplasm of respiratory organs: Secondary | ICD-10-CM

## 2023-10-12 DIAGNOSIS — Z87891 Personal history of nicotine dependence: Secondary | ICD-10-CM
